# Patient Record
Sex: Male | Born: 1995 | Race: White | Hispanic: No | Marital: Single | Smoking: Never smoker
Health system: Southern US, Community
[De-identification: ages and names within clinical notes are randomized; demographics above are authoritative.]

## PROBLEM LIST (undated history)

## (undated) DIAGNOSIS — R6250 Unspecified lack of expected normal physiological development in childhood: Secondary | ICD-10-CM

## (undated) DIAGNOSIS — C419 Malignant neoplasm of bone and articular cartilage, unspecified: Secondary | ICD-10-CM

## (undated) DIAGNOSIS — E162 Hypoglycemia, unspecified: Secondary | ICD-10-CM

## (undated) DIAGNOSIS — R3589 Other polyuria: Secondary | ICD-10-CM

## (undated) DIAGNOSIS — N62 Hypertrophy of breast: Secondary | ICD-10-CM

## (undated) DIAGNOSIS — G43909 Migraine, unspecified, not intractable, without status migrainosus: Secondary | ICD-10-CM

## (undated) DIAGNOSIS — R7302 Impaired glucose tolerance (oral): Secondary | ICD-10-CM

## (undated) DIAGNOSIS — E300 Delayed puberty: Secondary | ICD-10-CM

## (undated) DIAGNOSIS — E049 Nontoxic goiter, unspecified: Secondary | ICD-10-CM

## (undated) DIAGNOSIS — R358 Other polyuria: Secondary | ICD-10-CM

## (undated) DIAGNOSIS — E230 Hypopituitarism: Secondary | ICD-10-CM

## (undated) HISTORY — DX: Hypertrophy of breast: N62

## (undated) HISTORY — DX: Unspecified lack of expected normal physiological development in childhood: R62.50

## (undated) HISTORY — DX: Other polyuria: R35.8

## (undated) HISTORY — DX: Other polyuria: R35.89

## (undated) HISTORY — DX: Hypopituitarism: E23.0

## (undated) HISTORY — DX: Impaired glucose tolerance (oral): R73.02

## (undated) HISTORY — DX: Hypoglycemia, unspecified: E16.2

## (undated) HISTORY — DX: Nontoxic goiter, unspecified: E04.9

## (undated) HISTORY — PX: HYPOSPADIAS CORRECTION: SHX483

## (undated) HISTORY — DX: Delayed puberty: E30.0

## (undated) HISTORY — DX: Migraine, unspecified, not intractable, without status migrainosus: G43.909

---
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
## 2013-04-18 ENCOUNTER — Emergency Department (HOSPITAL_BASED_OUTPATIENT_CLINIC_OR_DEPARTMENT_OTHER)
Admission: EM | Admit: 2013-04-18 | Discharge: 2013-04-19 | Disposition: A | Discharge status: Home / Self Care | Payer: 59 | Attending: Emergency Medicine | Admitting: Emergency Medicine

## 2013-04-18 ENCOUNTER — Encounter (HOSPITAL_BASED_OUTPATIENT_CLINIC_OR_DEPARTMENT_OTHER): Payer: Self-pay | Admitting: Emergency Medicine

## 2013-04-18 ENCOUNTER — Emergency Department (HOSPITAL_BASED_OUTPATIENT_CLINIC_OR_DEPARTMENT_OTHER): Payer: 59

## 2013-04-18 DIAGNOSIS — Z8639 Personal history of other endocrine, nutritional and metabolic disease: Secondary | ICD-10-CM | POA: Insufficient documentation

## 2013-04-18 DIAGNOSIS — Z79899 Other long term (current) drug therapy: Secondary | ICD-10-CM | POA: Insufficient documentation

## 2013-04-18 DIAGNOSIS — Z87448 Personal history of other diseases of urinary system: Secondary | ICD-10-CM | POA: Insufficient documentation

## 2013-04-18 DIAGNOSIS — Y9339 Activity, other involving climbing, rappelling and jumping off: Secondary | ICD-10-CM | POA: Insufficient documentation

## 2013-04-18 DIAGNOSIS — E230 Hypopituitarism: Secondary | ICD-10-CM | POA: Insufficient documentation

## 2013-04-18 DIAGNOSIS — IMO0002 Reserved for concepts with insufficient information to code with codable children: Secondary | ICD-10-CM | POA: Insufficient documentation

## 2013-04-18 DIAGNOSIS — Z862 Personal history of diseases of the blood and blood-forming organs and certain disorders involving the immune mechanism: Secondary | ICD-10-CM | POA: Insufficient documentation

## 2013-04-18 DIAGNOSIS — Z880 Allergy status to penicillin: Secondary | ICD-10-CM | POA: Insufficient documentation

## 2013-04-18 DIAGNOSIS — Y929 Unspecified place or not applicable: Secondary | ICD-10-CM | POA: Insufficient documentation

## 2013-04-18 DIAGNOSIS — S060X9A Concussion with loss of consciousness of unspecified duration, initial encounter: Secondary | ICD-10-CM | POA: Insufficient documentation

## 2013-04-18 DIAGNOSIS — Z8679 Personal history of other diseases of the circulatory system: Secondary | ICD-10-CM | POA: Insufficient documentation

## 2013-04-18 DIAGNOSIS — W06XXXA Fall from bed, initial encounter: Secondary | ICD-10-CM | POA: Insufficient documentation

## 2013-04-18 NOTE — ED Notes (Signed)
Patient was jumping on the bed and hit his head against the wall  On Thursday.  Patient states that his head is hurting, has experienced nausea and his head hurts more.

## 2013-04-18 NOTE — ED Notes (Signed)
Patient transported to CT via stretcher per tech. 

## 2013-04-18 NOTE — ED Provider Notes (Signed)
CSN: [id number]     Arrival date & time 04/18/13  2129 History   This chart was scribed for Michael Lyons, MD by Ronal Fear, ED Scribe. This patient was seen in room MH12/MH12 and the patient's care was started at 10:35 PM.    Chief Complaint  Patient presents with  . Head Injury    Patient is a 17 y.o. male presenting with head injury. The history is provided by the patient. No language interpreter was used.  Head Injury Location:  Occipital Time since incident:  2 days Mechanism of injury: self-inflicted   Chronicity:  New Relieved by:  Rest Worsened by:  Nothing tried Ineffective treatments:  None tried Associated symptoms: headache, loss of consciousness and nausea   Associated symptoms: no blurred vision, no disorientation, no double vision, no hearing loss, no memory loss, no numbness and no vomiting     Pt hit the back of his head on the wall 2x days ago while jumping onto his bed in his dorm.  Pt lost consciousness. He was seen [facility] the school [facility] and was diagnosed with an acute concussion.  Pt does not currently feel nauseous but did feel nauseous immediately after the head trauma. He denies numbness and tingling. Pt has had 1 episode of visual disturbance yesterday.  Pt has a hx of vertigo  Past Medical History  Diagnosis Date  . Physical growth delay   . Growth hormone deficiency (human)   . Puberty delay   . Goiter   . Hypoglycemia   . Gynecomastia, male   . Polyuria   . Impaired glucose tolerance   . Migraines    Past Surgical History  Procedure Laterality Date  . Hypospadias correction     Family History  Problem Relation Age of Onset  . Thyroid disease Mother   . Diabetes Maternal Grandmother   . Thyroid disease Maternal Grandmother   . Cancer Maternal Grandmother    History  Substance Use Topics  . Smoking status: Never Smoker   . Smokeless tobacco: Never Used  . Alcohol Use: No    Review of Systems  HENT: Negative for hearing loss.    Eyes: Negative for blurred vision and double vision.  Gastrointestinal: Positive for nausea. Negative for vomiting.  Neurological: Positive for loss of consciousness and headaches. Negative for numbness.  Psychiatric/Behavioral: Negative for memory loss.  All other systems reviewed and are negative.    Allergies  Penicillins  Home Medications   Current Outpatient Rx  Name  Route  Sig  Dispense  Refill  . meclizine (ANTIVERT) 25 MG tablet   Oral   Take 25 mg by mouth 3 (three) times daily as needed.         . Cetirizine HCl (ZYRTEC PO)   Oral   Take by mouth as needed.           . Somatropin (NUTROPIN AQ [place])   Subcutaneous   Inject 4.8 mg into the skin daily.            BP 121/74  Pulse 63  Temp(Src) 98.5 F (36.9 C) (Oral)  Resp 16  Ht 5\' 10"  (1.778 m)  Wt 150 lb 6.4 oz (68.221 kg)  BMI 21.58 kg/m2  SpO2 98% Physical Exam  Nursing note and vitals reviewed. Constitutional: He is oriented to person, place, and time. He appears well-developed and well-nourished. No distress.  HENT:  Head: Normocephalic and atraumatic.  Right Ear: External ear normal.  Left Ear: External ear  normal.  Mouth/Throat: Oropharynx is clear and moist.  Eyes: Conjunctivae and EOM are normal. Pupils are equal, round, and reactive to light.  Neck: Normal range of motion. Neck supple. No tracheal deviation present.  Cardiovascular: Normal rate, regular rhythm and normal heart sounds.   Pulmonary/Chest: Effort normal and breath sounds normal. No respiratory distress.  Abdominal: Soft. There is no tenderness.  Musculoskeletal: Normal range of motion.  Neurological: He is alert and oriented to person, place, and time. No cranial nerve deficit. He exhibits normal muscle tone. Coordination normal.  Skin: Skin is warm and dry.  Psychiatric: He has a normal mood and affect. His behavior is normal.    ED Course  Procedures (including critical care time)  DIAGNOSTIC STUDIES: Oxygen  Saturation is 98% on Ra, normal by my interpretation.    COORDINATION OF CARE: 10:40 PM- Pt advised of plan for treatment including CT scan of pt's head and pt's mother agrees.      Labs Review Labs Reviewed - No data to display Imaging Review No results found.  EKG Interpretation   None       MDM  No diagnosis found. Patient presents here after striking his head 2 days ago while jumping onto his bed. States he was knocked out and has been having headaches intermittently since that time. Neurologically he is intact without focal deficit. CT scan of the head is unremarkable. This appears to be a concussion and will require more time. To follow up with primary Dr. if not improving in the next 2 days.  I personally performed the services described in this documentation, which was scribed in my presence. The recorded information has been reviewed and is accurate.      Michael Lyons, MD 04/18/13 2351

## 2013-04-18 NOTE — ED Notes (Signed)
Patient states that he hit his head on a wall Thursday at college and was diagnosed with a concussion by the college physician. He stated that they were going to send him to the ER for a CT scan, but the mother told them not to and she went and got him today and brought him here instead.  He states that he still has a headache and dizziness.

## 2013-04-19 MED ORDER — ACETAMINOPHEN 325 MG PO TABS
650.0000 mg | ORAL_TABLET | Freq: Once | ORAL | Status: AC
  Administered 2013-04-19: 650 mg via ORAL

## 2013-04-19 MED ORDER — ACETAMINOPHEN 325 MG PO TABS
ORAL_TABLET | ORAL | Status: AC
  Filled 2013-04-19: qty 2

## 2014-03-30 ENCOUNTER — Encounter (HOSPITAL_BASED_OUTPATIENT_CLINIC_OR_DEPARTMENT_OTHER): Payer: Self-pay | Admitting: Emergency Medicine

## 2014-03-30 ENCOUNTER — Emergency Department (HOSPITAL_BASED_OUTPATIENT_CLINIC_OR_DEPARTMENT_OTHER)
Admission: EM | Admit: 2014-03-30 | Discharge: 2014-03-30 | Disposition: A | Discharge status: Home / Self Care | Payer: 59 | Attending: Emergency Medicine | Admitting: Emergency Medicine

## 2014-03-30 ENCOUNTER — Emergency Department (HOSPITAL_BASED_OUTPATIENT_CLINIC_OR_DEPARTMENT_OTHER): Payer: 59

## 2014-03-30 DIAGNOSIS — N3000 Acute cystitis without hematuria: Secondary | ICD-10-CM | POA: Insufficient documentation

## 2014-03-30 DIAGNOSIS — Z862 Personal history of diseases of the blood and blood-forming organs and certain disorders involving the immune mechanism: Secondary | ICD-10-CM | POA: Insufficient documentation

## 2014-03-30 DIAGNOSIS — C419 Malignant neoplasm of bone and articular cartilage, unspecified: Secondary | ICD-10-CM | POA: Insufficient documentation

## 2014-03-30 DIAGNOSIS — R5081 Fever presenting with conditions classified elsewhere: Secondary | ICD-10-CM

## 2014-03-30 DIAGNOSIS — Z880 Allergy status to penicillin: Secondary | ICD-10-CM | POA: Diagnosis not present

## 2014-03-30 DIAGNOSIS — Z87440 Personal history of urinary (tract) infections: Secondary | ICD-10-CM | POA: Insufficient documentation

## 2014-03-30 DIAGNOSIS — D709 Neutropenia, unspecified: Secondary | ICD-10-CM | POA: Diagnosis not present

## 2014-03-30 DIAGNOSIS — R509 Fever, unspecified: Secondary | ICD-10-CM | POA: Diagnosis present

## 2014-03-30 DIAGNOSIS — Z8679 Personal history of other diseases of the circulatory system: Secondary | ICD-10-CM | POA: Insufficient documentation

## 2014-03-30 DIAGNOSIS — Z87710 Personal history of (corrected) hypospadias: Secondary | ICD-10-CM | POA: Diagnosis not present

## 2014-03-30 DIAGNOSIS — Z8639 Personal history of other endocrine, nutritional and metabolic disease: Secondary | ICD-10-CM | POA: Insufficient documentation

## 2014-03-30 DIAGNOSIS — Z79899 Other long term (current) drug therapy: Secondary | ICD-10-CM | POA: Insufficient documentation

## 2014-03-30 HISTORY — DX: Malignant neoplasm of bone and articular cartilage, unspecified: C41.9

## 2014-03-30 LAB — COMPREHENSIVE METABOLIC PANEL
ALT: 81 U/L — AB (ref 0–53)
AST: 19 U/L (ref 0–37)
Albumin: 4 g/dL (ref 3.5–5.2)
Alkaline Phosphatase: 107 U/L (ref 39–117)
Anion gap: 16 — ABNORMAL HIGH (ref 5–15)
BUN: 13 mg/dL (ref 6–23)
CALCIUM: 9.8 mg/dL (ref 8.4–10.5)
CO2: 25 mEq/L (ref 19–32)
Chloride: 95 mEq/L — ABNORMAL LOW (ref 96–112)
Creatinine, Ser: 0.9 mg/dL (ref 0.50–1.35)
GFR calc non Af Amer: >90 mL/min (ref >90)
Glucose, Bld: 115 mg/dL — ABNORMAL HIGH (ref 70–99)
Potassium: 3.9 mEq/L (ref 3.7–5.3)
SODIUM: 136 meq/L — AB (ref 137–147)
TOTAL PROTEIN: 7.8 g/dL (ref 6.0–8.3)
Total Bilirubin: 1.1 mg/dL (ref 0.3–1.2)

## 2014-03-30 LAB — URINALYSIS, ROUTINE W REFLEX MICROSCOPIC
GLUCOSE, UA: NEGATIVE mg/dL
HGB URINE DIPSTICK: NEGATIVE
Ketones, ur: 15 mg/dL — AB
Nitrite: NEGATIVE
Protein, ur: 30 mg/dL — AB
SPECIFIC GRAVITY, URINE: 1.03 (ref 1.005–1.030)
UROBILINOGEN UA: 2 mg/dL — AB (ref 0.0–1.0)
pH: 7 (ref 5.0–8.0)

## 2014-03-30 LAB — CBC WITH DIFFERENTIAL/PLATELET
BASOS ABS: 0 10*3/uL (ref 0.0–0.1)
Basophils Relative: 0 % (ref 0–1)
Eosinophils Absolute: 0 10*3/uL (ref 0.0–0.7)
Eosinophils Relative: 2 % (ref 0–5)
HCT: 33 % — ABNORMAL LOW (ref 39.0–52.0)
Hemoglobin: 11.8 g/dL — ABNORMAL LOW (ref 13.0–17.0)
LYMPHS ABS: 0.4 10*3/uL — AB (ref 0.7–4.0)
Lymphocytes Relative: 77 % — ABNORMAL HIGH (ref 12–46)
MCH: 30.2 pg (ref 26.0–34.0)
MCHC: 35.8 g/dL (ref 30.0–36.0)
MCV: 84.4 fL (ref 78.0–100.0)
MONO ABS: 0.1 10*3/uL (ref 0.1–1.0)
Monocytes Relative: 19 % — ABNORMAL HIGH (ref 3–12)
Neutro Abs: 0 10*3/uL — ABNORMAL LOW (ref 1.7–7.7)
Neutrophils Relative %: 2 % — ABNORMAL LOW (ref 43–77)
PLATELETS: 35 10*3/uL — AB (ref 150–400)
RBC: 3.91 MIL/uL — ABNORMAL LOW (ref 4.22–5.81)
RDW: 10.5 % — AB (ref 11.5–15.5)
WBC: 0.5 10*3/uL — AB (ref 4.0–10.5)

## 2014-03-30 LAB — URINE MICROSCOPIC-ADD ON

## 2014-03-30 LAB — I-STAT CG4 LACTIC ACID, ED: LACTIC ACID, VENOUS: 0.84 mmol/L (ref 0.5–2.2)

## 2014-03-30 MED ORDER — VANCOMYCIN HCL 500 MG IV SOLR
INTRAVENOUS | Status: AC
  Filled 2014-03-30: qty 1500

## 2014-03-30 MED ORDER — HEPARIN SOD (PORK) LOCK FLUSH 100 UNIT/ML IV SOLN
INTRAVENOUS | Status: AC
  Filled 2014-03-30: qty 5

## 2014-03-30 MED ORDER — METOCLOPRAMIDE HCL 5 MG/ML IJ SOLN
10.0000 mg | Freq: Once | INTRAMUSCULAR | Status: AC
  Administered 2014-03-30: 10 mg via INTRAVENOUS
  Filled 2014-03-30: qty 2

## 2014-03-30 MED ORDER — KETOROLAC TROMETHAMINE 30 MG/ML IJ SOLN
30.0000 mg | Freq: Once | INTRAMUSCULAR | Status: AC
  Administered 2014-03-30: 30 mg via INTRAVENOUS

## 2014-03-30 MED ORDER — DIPHENHYDRAMINE HCL 50 MG/ML IJ SOLN
25.0000 mg | Freq: Once | INTRAMUSCULAR | Status: AC
  Administered 2014-03-30: 25 mg via INTRAVENOUS
  Filled 2014-03-30: qty 1

## 2014-03-30 MED ORDER — VANCOMYCIN HCL 10 G IV SOLR
1500.0000 mg | Freq: Once | INTRAVENOUS | Status: AC
  Administered 2014-03-30: 1500 mg via INTRAVENOUS
  Filled 2014-03-30: qty 1500

## 2014-03-30 MED ORDER — DEXTROSE 5 % IV SOLN
2.0000 g | Freq: Three times a day (TID) | INTRAVENOUS | Status: DC
  Administered 2014-03-30: 2 g via INTRAVENOUS

## 2014-03-30 MED ORDER — HEPARIN SOD (PORK) LOCK FLUSH 100 UNIT/ML IV SOLN
500.0000 [IU] | Freq: Once | INTRAVENOUS | Status: AC
  Administered 2014-03-30: 500 [IU]

## 2014-03-30 MED ORDER — CEFEPIME HCL 2 G IJ SOLR
INTRAMUSCULAR | Status: AC
  Filled 2014-03-30: qty 2

## 2014-03-30 MED ORDER — KETOROLAC TROMETHAMINE 30 MG/ML IJ SOLN
INTRAMUSCULAR | Status: AC
  Filled 2014-03-30: qty 1

## 2014-03-30 NOTE — ED Notes (Signed)
Family at bedside. 

## 2014-03-30 NOTE — Progress Notes (Signed)
ANTIBIOTIC CONSULT NOTE - INITIAL  Pharmacy Consult for Cefepime Indication: febrile neutropenia  Allergies  Allergen Reactions  . Penicillins     Patient Measurements: Height: 5\' 10"  (177.8 cm) Weight: 155 lb (70.308 kg) IBW/kg (Calculated) : 73 Adjusted Body Weight:   Vital Signs: Temp: 100.5 F (38.1 C) (09/22 1514) Temp src: Oral (09/22 1514) BP: 102/61 mmHg (09/22 1514) Pulse Rate: 102 (09/22 1514) Intake/Output from previous day:   Intake/Output from this shift:    Labs:  Recent Labs  03/30/14 1537  WBC 0.5*  HGB 11.8*  PLT 35*  CREATININE 0.90   Estimated Creatinine Clearance: 131.3 ml/min (by C-G formula based on Cr of 0.9). No results found for this basename: VANCOTROUGH, VANCOPEAK, VANCORANDOM, GENTTROUGH, GENTPEAK, GENTRANDOM, TOBRATROUGH, TOBRAPEAK, TOBRARND, AMIKACINPEAK, AMIKACINTROU, AMIKACIN,  in the last 72 hours   Microbiology: No results found for this or any previous visit (from the past 720 hour(s)).  Medical History: Past Medical History  Diagnosis Date  . Physical growth delay   . Growth hormone deficiency (human)   . Puberty delay   . Goiter   . Hypoglycemia   . Gynecomastia, male   . Polyuria   . Impaired glucose tolerance   . Migraines   . Ewing sarcoma     Medications:   (Not in a hospital admission) Scheduled:  . vancomycin       Infusions:  . ceFEPime (MAXIPIME) IV    . vancomycin     Assessment: 18yo male presents to [place] with dysuria for 2 days and fever in setting of recent start of chemotherapy for Ewing sarcoma. Pharmacy is consulted to dose cefepime for febrile neutropenia. Pt is febrile to 100.5, WBC 0.5, sCr 0.9, CrCl >100 mL/min.   Goal of Therapy:  Resolution of infection  Plan:  Start cefepime 2g IV q8h Follow up culture results, renal function, and clinical course  Andrey Cota. Diona Foley, PharmD Clinical Pharmacist Pager [phone] 03/30/2014,6:09 PM

## 2014-03-30 NOTE — Discharge Instructions (Signed)
Neutropenic Fever °Neutropenic fever is a type of fever that can develop in someone who has a very low number of a certain kind of white blood cells called neutrophils (neutropenia). These blood cells are important for fighting infections caused by bacteria and fungi. When you have neutropenia, you could be in danger of a severe infection. You may need to start taking antibiotic medicines. °CAUSES °Neutrophils are made in the spongy tissue inside your bones (bone marrow). Anything that damages your bone marrow or damages neutrophils after they leave your bone marrow can cause neutropenia. Once you have a dangerously low level of neutrophils, you are at risk for infection and neutropenic fever. °Causes of neutropenia may include: °· Cancer treatments. °· Bone marrow cancer. °· Cancer of the white blood cells (leukemia or myeloma). °· Severe infection. °· Bone marrow failure (aplastic anemia). °· Many types of medicines. °· Diseases of the body's defense system (autoimmune diseases). °· Inherited genes that cause neutropenia. °· Vitamin B deficiency. °· Spleen enlargement in rheumatoid arthritis (Felty syndrome). °SIGNS AND SYMPTOMS °Fever is the main symptom of neutropenic fever. Other signs and symptoms may include: °· Chills. °· Fatigue. °· Painful mouth ulcers. °· Cough. °· Shortness of breath. °· Swollen glands (lymph nodes). °· Sore throat. °· Sinus and ear infections. °· Gum disease. °· Skin infection. °· Burning and frequent urination. °· Rectal infections. °· Vaginal discharge or itching. °DIAGNOSIS  °Your health care provider may diagnose neutropenic fever if your neutrophil count is less than 500 neutrophils per microliter of blood and you have a fever of at least 100.4°F (38.0°C).  °· Blood tests and other tests that measure neutrophils will be done. These may include: °¨ A complete blood count (CBC) and a differential white blood count (WBC). °¨ Peripheral smear. This test involves checking a blood sample  under a microscope. °· Other types of tests may also be done, including: °¨ Chest X-rays. °¨ Cultures of blood and body fluids to look for a source of infection. °Your health care provider will also determine if your neutropenic fever is high risk or low risk. °· You may have high-risk neutropenic fever if: °¨ Your neutrophil count is less than 100 neutrophils per microliter of blood. °¨ You have also been diagnosed with pneumonia or another serious medical problem. °¨ Your condition requires you to be treated in the hospital. °· You may have low-risk neutropenic fever if: °¨ Your neutrophil count is more than 100 neutrophils per microliter of blood. °¨ Your chest X-ray is normal. °¨ You do not have an active illness or any other problems that require you to be in the hospital. °TREATMENT  °You may start treatment as soon as you get diagnosed with neutropenic fever, even if your health care provider is still looking for the source of infection. °· Treatment for high-risk neutropenic fever is antibiotic medicine given through an IV access tube. This is done in the hospital. You may be given a single antibiotic or a combination of antibiotics. °· Low-risk neutropenic fever may be treated at home. You may have to take one or two different oral antibiotics. In some cases, you may need to be treated with IV antibiotics that are given by a home health care provider who visits your home. °· If your health care provider finds a specific cause of infection, you may be switched to the antibiotics that work best against those particular bacteria. °· If a fungal infection is found, your medicine will be changed to an antifungal   medicine. °· If the fever goes away in 3-5 days, you may have to take medicine for about 7 days. If the fever is not responding, you may have to take medicine longer. °· You may have to stop taking any medicine that could be causing neutropenic fever. °· If you have neutropenic fever from cancer  treatment drugs (chemotherapy), you may need to take a type of medicine called white blood cell growth factors. This medicine can help prevent fever. °HOME CARE INSTRUCTIONS °· Only take medicines as directed by your health care provider. °¨ If you are being treated with oral antibiotics at home, you may need to return to your health care provider every day to have your CBC checked. You may have to do this until your fever responds. °¨ Take your antibiotics as directed. Make sure you finish them even if you start to feel better. °· Preventing infection is important when you have neutropenia. Here are some ways to prevent infections: °¨ Avoid sick friends and family members. °¨ Wash your hands often. °¨ Do not eat uncooked or undercooked meats. °¨ Wash all fruits and vegetables. °¨ Do not eat or drink unpasteurized dairy products. °¨ Get regular dental care, and maintain good dental hygiene. °¨ Get a flu shot. Ask your health care provider whether you need any other vaccines. °¨ Wear gloves when gardening. °· Follow up with your health care provider as directed. °SEEK MEDICAL CARE IF: °· You have chills. °· You have a fever. °· You have signs or symptoms of infection. °SEEK IMMEDIATE MEDICAL CARE IF: °· You have trouble breathing. °· You have chest pain. °Document Released: 06/30/2013 Document Reviewed: 06/30/2013 °[employer]® Patient Information ©2015 [employer]. This information is not intended to replace advice given to you by your health care provider. Make sure you discuss any questions you have with your health care provider. ° °

## 2014-03-30 NOTE — ED Notes (Signed)
MD at bedside. 

## 2014-03-30 NOTE — ED Notes (Signed)
Patient transported to X-ray 

## 2014-03-30 NOTE — ED Provider Notes (Signed)
CSN: [id number]     Arrival date & time 03/30/14  1455 History   First MD Initiated Contact with Patient 03/30/14 1535     Chief Complaint  Patient presents with  . Fever     (Consider location/radiation/quality/duration/timing/severity/associated sxs/prior Treatment) Patient is a 18 y.o. male presenting with dysuria.  Dysuria This is a new problem. The current episode started 2 days ago. The problem occurs constantly. The problem has not changed since onset.Associated symptoms include headaches. Pertinent negatives include no chest pain, no abdominal pain and no shortness of breath. Nothing aggravates the symptoms. Nothing relieves the symptoms. He has tried nothing for the symptoms.    Past Medical History  Diagnosis Date  . Physical growth delay   . Growth hormone deficiency (human)   . Puberty delay   . Goiter   . Hypoglycemia   . Gynecomastia, male   . Polyuria   . Impaired glucose tolerance   . Migraines   . Ewing sarcoma    Past Surgical History  Procedure Laterality Date  . Hypospadias correction     Family History  Problem Relation Age of Onset  . Thyroid disease Mother   . Diabetes Maternal Grandmother   . Thyroid disease Maternal Grandmother   . Cancer Maternal Grandmother    History  Substance Use Topics  . Smoking status: Never Smoker   . Smokeless tobacco: Never Used  . Alcohol Use: No    Review of Systems  Constitutional: Positive for fever (tmax 101.1). Negative for chills.  HENT: Negative for congestion, rhinorrhea and sore throat.   Eyes: Negative for photophobia and visual disturbance.  Respiratory: Negative for cough and shortness of breath.   Cardiovascular: Negative for chest pain and leg swelling.  Gastrointestinal: Negative for nausea, vomiting, abdominal pain, diarrhea and constipation.  Endocrine: Negative for polydipsia and polyuria.  Genitourinary: Positive for dysuria. Negative for hematuria.  Musculoskeletal: Negative for  arthralgias and back pain.  Skin: Negative for color change and rash.  Neurological: Positive for headaches. Negative for dizziness, syncope and light-headedness.  Hematological: Negative for adenopathy. Does not bruise/bleed easily.  All other systems reviewed and are negative.     Allergies  Penicillins  Home Medications   Prior to Admission medications   Medication Sig Start Date End Date Taking? Authorizing Provider  LORazepam (ATIVAN) 1 MG tablet Take 1 mg by mouth every 8 (eight) hours.   Yes Historical Provider, MD  ondansetron (ZOFRAN-ODT) 8 MG disintegrating tablet Take 8 mg by mouth every 8 (eight) hours as needed for nausea or vomiting.   Yes Historical Provider, MD  traMADol (ULTRAM) 50 MG tablet Take by mouth every 6 (six) hours as needed.   Yes Historical Provider, MD   BP 96/61  Pulse 90  Temp(Src) 100.6 F (38.1 C) (Oral)  Resp 16  Ht 5\' 10"  (1.778 m)  Wt 155 lb (70.308 kg)  BMI 22.24 kg/m2  SpO2 100% Physical Exam  Vitals reviewed. Constitutional: He is oriented to person, place, and time. He appears well-developed and well-nourished.  HENT:  Head: Normocephalic and atraumatic.  Eyes: Conjunctivae and EOM are normal.  Neck: Normal range of motion. Neck supple.  Cardiovascular: Normal rate, regular rhythm and normal heart sounds.   Pulmonary/Chest: Effort normal and breath sounds normal. No respiratory distress.  Abdominal: He exhibits no distension. There is no tenderness. There is no rebound and no guarding.  Musculoskeletal: Normal range of motion.  Neurological: He is alert and oriented to person, place, and time.  Skin: Skin is warm and dry.    ED Course  Procedures (including critical care time) Labs Review Labs Reviewed  CBC WITH DIFFERENTIAL - Abnormal; Notable for the following:    WBC 0.5 (*)    RBC 3.91 (*)    Hemoglobin 11.8 (*)    HCT 33.0 (*)    RDW 10.5 (*)    Platelets 35 (*)    Neutrophils Relative % 2 (*)    Lymphocytes  Relative 77 (*)    Monocytes Relative 19 (*)    Neutro Abs 0.0 (*)    Lymphs Abs 0.4 (*)    All other components within normal limits  COMPREHENSIVE METABOLIC PANEL - Abnormal; Notable for the following:    Sodium 136 (*)    Chloride 95 (*)    Glucose, Bld 115 (*)    ALT 81 (*)    Anion gap 16 (*)    All other components within normal limits  URINALYSIS, ROUTINE W REFLEX MICROSCOPIC - Abnormal; Notable for the following:    Color, Urine ORANGE (*)    APPearance CLOUDY (*)    Bilirubin Urine SMALL (*)    Ketones, ur 15 (*)    Protein, ur 30 (*)    Urobilinogen, UA 2.0 (*)    Leukocytes, UA TRACE (*)    All other components within normal limits  CULTURE, BLOOD (SINGLE)  CULTURE, BLOOD (SINGLE)  URINE CULTURE  URINE MICROSCOPIC-ADD ON  I-STAT CG4 LACTIC ACID, ED    Imaging Review Dg Chest 2 View  03/30/2014   CLINICAL DATA:  Fever, hematuria  EXAM: CHEST  2 VIEW  COMPARISON:  None.  FINDINGS: Cardiomediastinal silhouette is unremarkable. Right IJ Port-A-Cath in place. No acute infiltrate or pulmonary edema. Bony thorax is unremarkable.  IMPRESSION: No active cardiopulmonary disease.   Electronically Signed   By: Lahoma Crocker M.D.   On: 03/30/2014 16:05     EKG Interpretation None      MDM   Final diagnoses:  Febrile neutropenia  Acute cystitis without hematuria    18 y.o. male with pertinent PMH of ewings sarcoma, hypospadias sp repair presents with fever in setting of recent start of chemotherapy for Ewing sarcoma. The patient has had 3 urinary tract infections over the last 6 months after hypospadias repair. Patient's symptoms began approximately 2 days ago with dysuria, hematuria, yesterday patient developed temperature to 100.3, followed today by temperature of 101.1. He had just started chemotherapy.  The patient has had a headache, however does have a history of migraines and states this one does feel somewhat similar.  On arrival today vitals signs as above with oral  temperature 100.5. Physical exam demonstrated no signs of meningismus or abdominal tenderness. Port site which was placed in the last month is not erythematous and mildly tender.  Labs as above with 0.0 ANC.  Spoke with [facility] oncologist on call.  They advised vanc/cefepime and transfer for admission.  Discussed transfer of pt, and agreed that he was stable to transfer by POV with parents, and spoke with them and they agree.  Pt transferred in stable condition after port deaccessed.  1. Febrile neutropenia   2. Acute cystitis without hematuria         Debby Freiberg, MD 03/31/14 [phone]

## 2014-03-30 NOTE — ED Notes (Addendum)
Patient here with fever and hematuria that started last pm. Just finished first cycle of chemo at [facility] for Ewings sarcoma. Does have port and scheduled to begin 2nd round of chemo on 9/30. Patient pale on arrival, and 101 highest today. Decreased appetite. Mother also reports 2 episodes of vomiting. Until last pm has had no problems with his chemo

## 2014-03-31 LAB — URINE CULTURE: Colony Count: 30000

## 2014-04-05 LAB — CULTURE, BLOOD (SINGLE)
CULTURE: NO GROWTH
Culture: NO GROWTH

## 2014-12-01 IMAGING — CT CT HEAD W/O CM
1 series · 16 of 30 positions shown, 20 images · non-contrast
Comparison: MRI of the brain performed 04/15/2007

CLINICAL DATA: Hit base of head against wall; headache, blurred
vision and dizziness.

CT HEAD WITHOUT CONTRAST
TECHNIQUE: Contiguous axial images were obtained from the base of
the skull through the vertex without contrast.

[Series 2: head 4.8 h37s · axial · 0.44mm/px · z∈[+1135,+1295]mm · 16 of 36 slices shown, 20 images]
[im 2/36  brain]
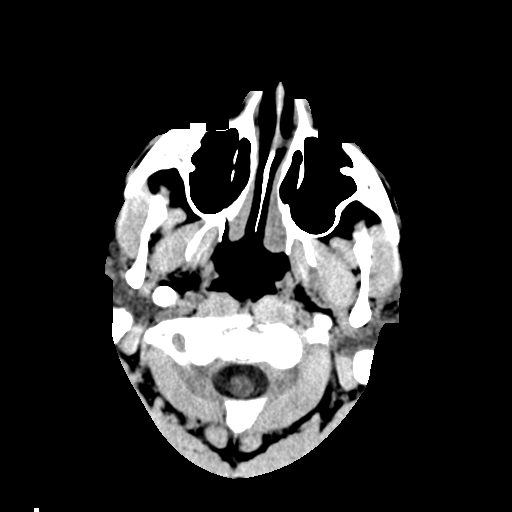
[im 2/36  bone]
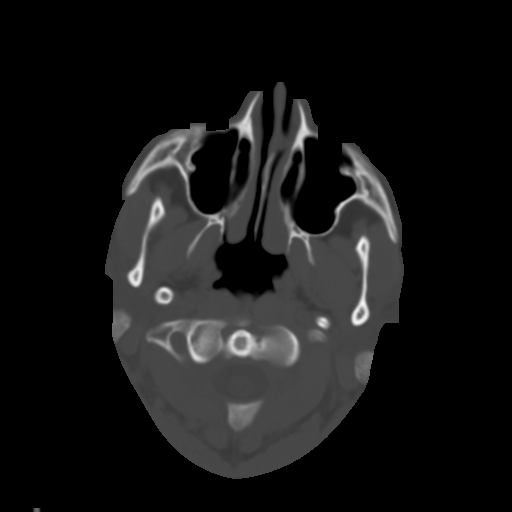
[im 4/36  brain]
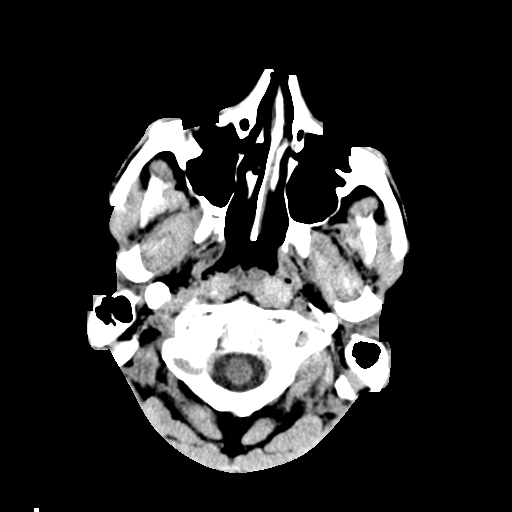
[im 7/36  brain]
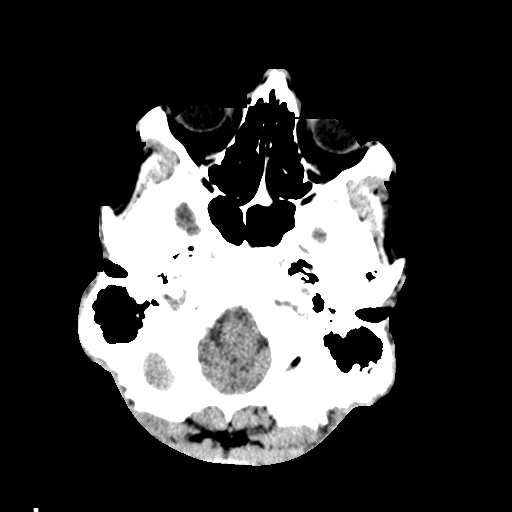
[im 9/36  brain]
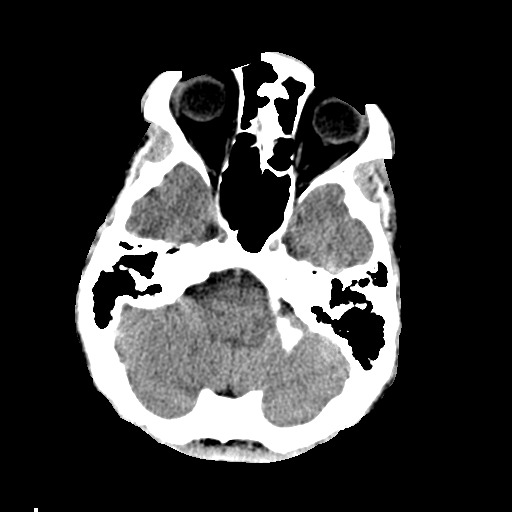
[im 10/36  brain]
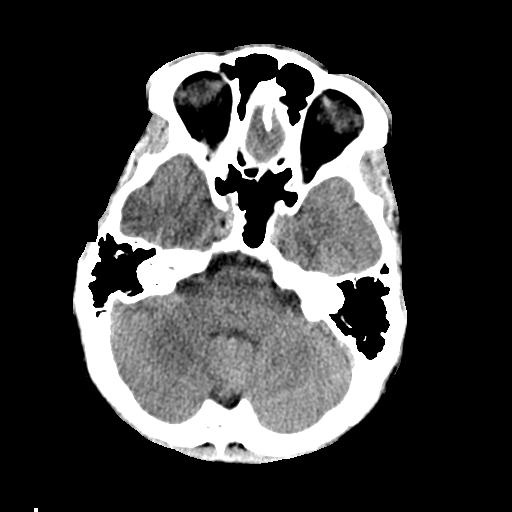
[im 10/36  bone]
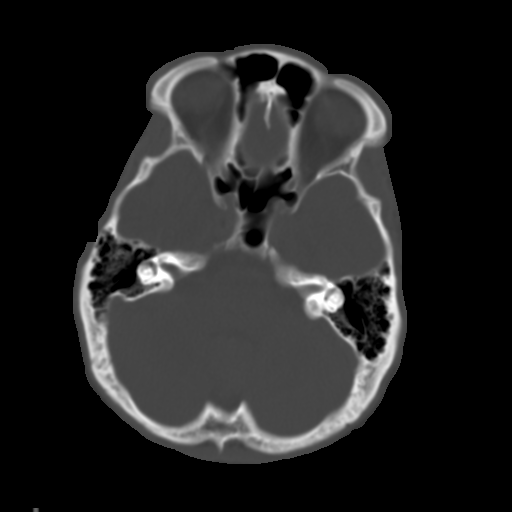
[im 13/36  brain]
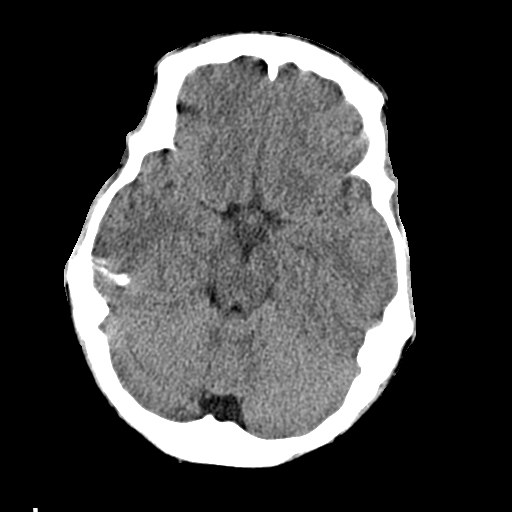
[im 15/36  brain]
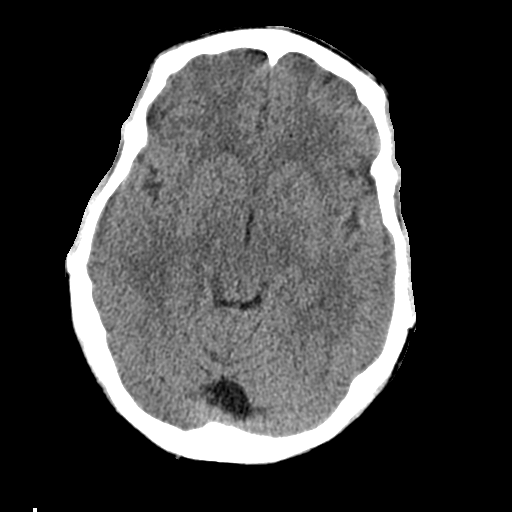
[im 17/36  brain]
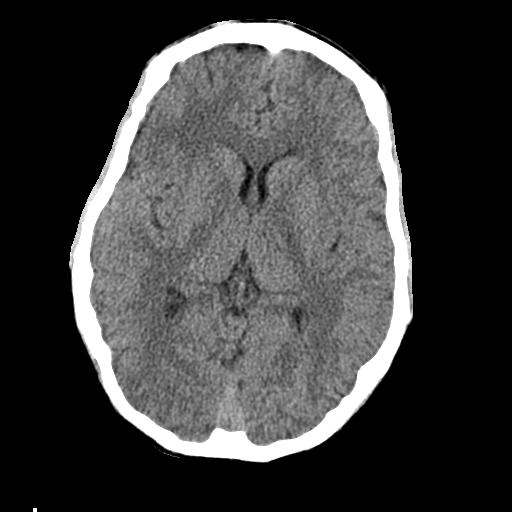
[im 19/36  brain]
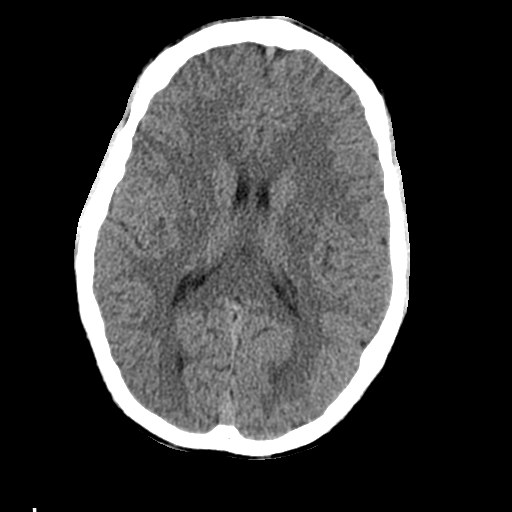
[im 19/36  bone]
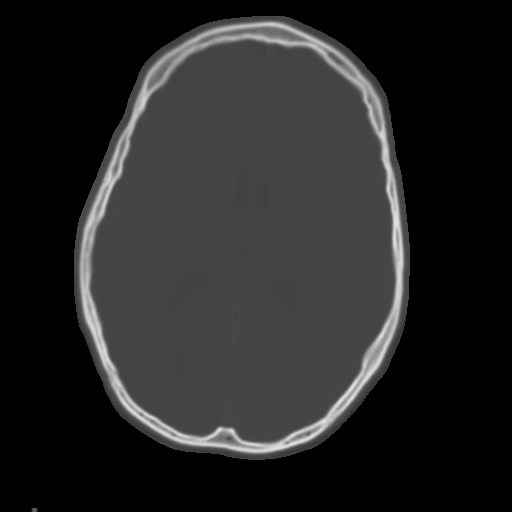
[im 21/36  brain]
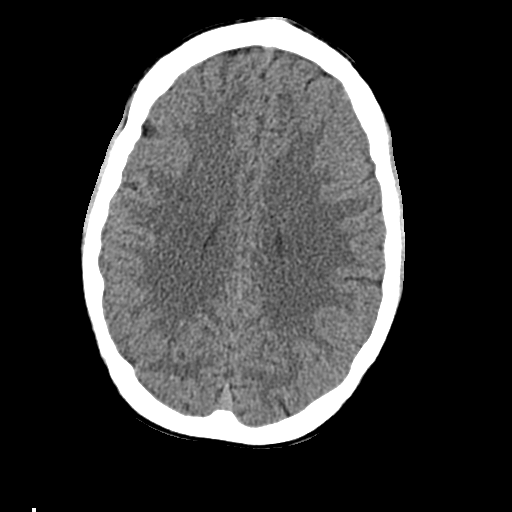
[im 23/36  brain]
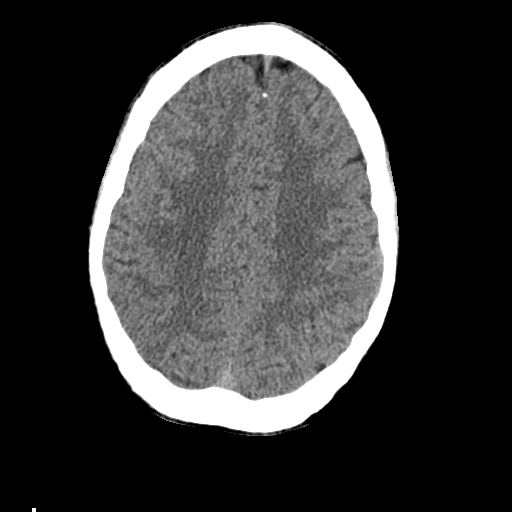
[im 26/36  brain]
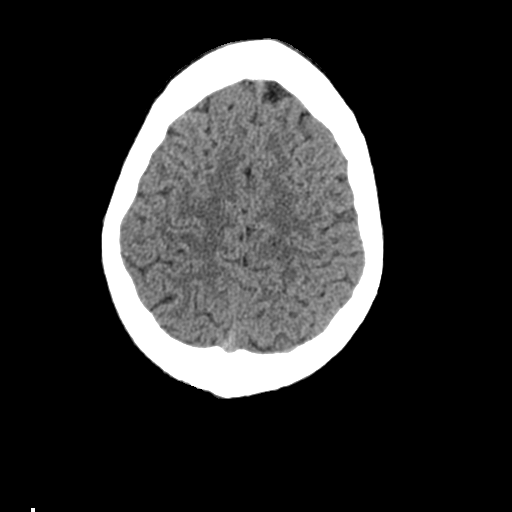
[im 27/36  brain]
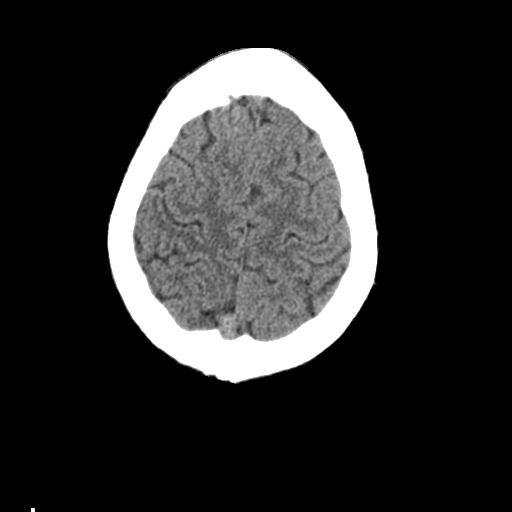
[im 27/36  bone]
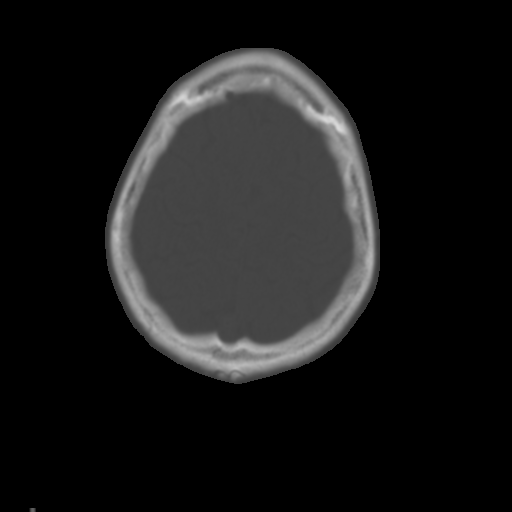
[im 29/36  brain]
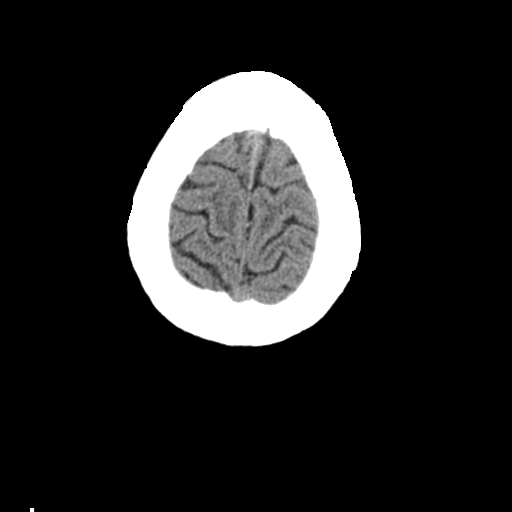
[im 32/36  brain]
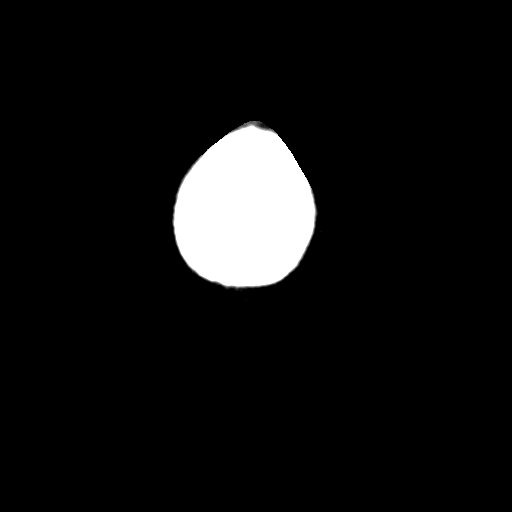
[im 34/36  brain]
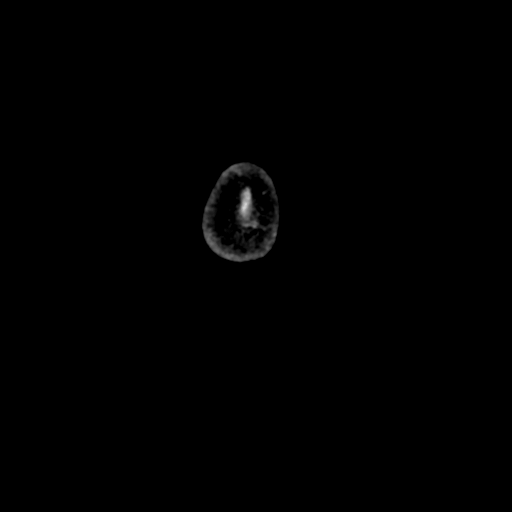

[16 of 30 positions shown; findings below may reference images not displayed]

FINDINGS: There is no evidence of acute infarction, mass lesion, or
intra- or extra-axial hemorrhage on CT.

The posterior fossa, including the cerebellum, brainstem and fourth
ventricle, is within normal limits.  The third and lateral
ventricles, and basal ganglia are unremarkable in appearance.  The
cerebral hemispheres are symmetric in appearance, with normal gray-
white differentiation.  No mass effect or midline shift is seen.

There is no evidence of fracture; visualized osseous structures are
unremarkable in appearance.  The orbits are within normal limits.
The paranasal sinuses and mastoid air cells are well-aerated.  No
significant soft tissue abnormalities are seen.
IMPRESSION: No evidence of traumatic intracranial injury or fracture.

## 2023-11-18 ENCOUNTER — Other Ambulatory Visit: Payer: Self-pay

## 2023-11-18 ENCOUNTER — Emergency Department (HOSPITAL_BASED_OUTPATIENT_CLINIC_OR_DEPARTMENT_OTHER)

## 2023-11-18 ENCOUNTER — Encounter (HOSPITAL_BASED_OUTPATIENT_CLINIC_OR_DEPARTMENT_OTHER): Payer: Self-pay

## 2023-11-18 ENCOUNTER — Observation Stay (HOSPITAL_BASED_OUTPATIENT_CLINIC_OR_DEPARTMENT_OTHER)
Admission: EM | Admit: 2023-11-18 | Discharge: 2023-11-20 | Disposition: A | Discharge status: Home / Self Care | Attending: Internal Medicine | Admitting: Internal Medicine

## 2023-11-18 DIAGNOSIS — Z79899 Other long term (current) drug therapy: Secondary | ICD-10-CM | POA: Diagnosis not present

## 2023-11-18 DIAGNOSIS — R55 Syncope and collapse: Secondary | ICD-10-CM | POA: Diagnosis not present

## 2023-11-18 DIAGNOSIS — E860 Dehydration: Secondary | ICD-10-CM | POA: Insufficient documentation

## 2023-11-18 DIAGNOSIS — R1110 Vomiting, unspecified: Secondary | ICD-10-CM | POA: Diagnosis present

## 2023-11-18 DIAGNOSIS — N179 Acute kidney failure, unspecified: Secondary | ICD-10-CM | POA: Insufficient documentation

## 2023-11-18 DIAGNOSIS — N12 Tubulo-interstitial nephritis, not specified as acute or chronic: Principal | ICD-10-CM | POA: Diagnosis present

## 2023-11-18 DIAGNOSIS — N3000 Acute cystitis without hematuria: Secondary | ICD-10-CM | POA: Diagnosis not present

## 2023-11-18 DIAGNOSIS — N10 Acute tubulo-interstitial nephritis: Secondary | ICD-10-CM | POA: Diagnosis not present

## 2023-11-18 DIAGNOSIS — G43719 Chronic migraine without aura, intractable, without status migrainosus: Secondary | ICD-10-CM

## 2023-11-18 DIAGNOSIS — C419 Malignant neoplasm of bone and articular cartilage, unspecified: Secondary | ICD-10-CM | POA: Diagnosis not present

## 2023-11-18 DIAGNOSIS — N390 Urinary tract infection, site not specified: Secondary | ICD-10-CM | POA: Diagnosis not present

## 2023-11-18 DIAGNOSIS — G43909 Migraine, unspecified, not intractable, without status migrainosus: Secondary | ICD-10-CM | POA: Diagnosis not present

## 2023-11-18 LAB — COMPREHENSIVE METABOLIC PANEL WITH GFR
ALT: 38 U/L (ref 0–44)
AST: 29 U/L (ref 15–41)
Albumin: 4.9 g/dL (ref 3.5–5.0)
Alkaline Phosphatase: 114 U/L (ref 38–126)
Anion gap: 14 (ref 5–15)
BUN: 13 mg/dL (ref 6–20)
CO2: 24 mmol/L (ref 22–32)
Calcium: 10.2 mg/dL (ref 8.9–10.3)
Chloride: 98 mmol/L (ref 98–111)
Creatinine, Ser: 1.33 mg/dL — ABNORMAL HIGH (ref 0.61–1.24)
GFR, Estimated: >60 mL/min (ref >60)
Glucose, Bld: 111 mg/dL — ABNORMAL HIGH (ref 70–99)
Potassium: 3.7 mmol/L (ref 3.5–5.1)
Sodium: 136 mmol/L (ref 135–145)
Total Bilirubin: 0.6 mg/dL (ref 0.0–1.2)
Total Protein: 7.5 g/dL (ref 6.5–8.1)

## 2023-11-18 LAB — URINALYSIS, ROUTINE W REFLEX MICROSCOPIC
Bilirubin Urine: NEGATIVE
Glucose, UA: NEGATIVE mg/dL
Hgb urine dipstick: NEGATIVE
Ketones, ur: NEGATIVE mg/dL
Nitrite: NEGATIVE
Protein, ur: 30 mg/dL — AB
Specific Gravity, Urine: 1.015 (ref 1.005–1.030)
pH: 8.5 — ABNORMAL HIGH (ref 5.0–8.0)

## 2023-11-18 LAB — URINALYSIS, MICROSCOPIC (REFLEX)

## 2023-11-18 LAB — CBC WITH DIFFERENTIAL/PLATELET
Abs Immature Granulocytes: 0.1 10*3/uL — ABNORMAL HIGH (ref 0.00–0.07)
Basophils Absolute: 0.1 10*3/uL (ref 0.0–0.1)
Basophils Relative: 0 %
Eosinophils Absolute: 0 10*3/uL (ref 0.0–0.5)
Eosinophils Relative: 0 %
HCT: 43.3 % (ref 39.0–52.0)
Hemoglobin: 15.1 g/dL (ref 13.0–17.0)
Immature Granulocytes: 1 %
Lymphocytes Relative: 5 %
Lymphs Abs: 0.8 10*3/uL (ref 0.7–4.0)
MCH: 30.9 pg (ref 26.0–34.0)
MCHC: 34.9 g/dL (ref 30.0–36.0)
MCV: 88.7 fL (ref 80.0–100.0)
Monocytes Absolute: 1.1 10*3/uL — ABNORMAL HIGH (ref 0.1–1.0)
Monocytes Relative: 6 %
Neutro Abs: 14.6 10*3/uL — ABNORMAL HIGH (ref 1.7–7.7)
Neutrophils Relative %: 88 %
Platelets: 184 10*3/uL (ref 150–400)
RBC: 4.88 MIL/uL (ref 4.22–5.81)
RDW: 11.9 % (ref 11.5–15.5)
WBC: 16.9 10*3/uL — ABNORMAL HIGH (ref 4.0–10.5)
nRBC: 0 % (ref 0.0–0.2)

## 2023-11-18 LAB — RESP PANEL BY RT-PCR (RSV, FLU A&B, COVID)  RVPGX2
Influenza A by PCR: NEGATIVE
Influenza B by PCR: NEGATIVE
Resp Syncytial Virus by PCR: NEGATIVE
SARS Coronavirus 2 by RT PCR: NEGATIVE

## 2023-11-18 LAB — LIPASE, BLOOD: Lipase: 26 U/L (ref 11–51)

## 2023-11-18 LAB — LACTIC ACID, PLASMA: Lactic Acid, Venous: 1.8 mmol/L (ref 0.5–1.9)

## 2023-11-18 MED ORDER — KETOROLAC TROMETHAMINE 15 MG/ML IJ SOLN
15.0000 mg | Freq: Once | INTRAMUSCULAR | Status: AC
  Administered 2023-11-18: 15 mg via INTRAVENOUS
  Filled 2023-11-18: qty 1

## 2023-11-18 MED ORDER — ONDANSETRON HCL 4 MG/2ML IJ SOLN
4.0000 mg | Freq: Four times a day (QID) | INTRAMUSCULAR | Status: DC | PRN
  Administered 2023-11-20: 4 mg via INTRAVENOUS
  Filled 2023-11-18: qty 2

## 2023-11-18 MED ORDER — LEVOFLOXACIN IN D5W 750 MG/150ML IV SOLN
750.0000 mg | Freq: Once | INTRAVENOUS | Status: AC
  Administered 2023-11-18: 750 mg via INTRAVENOUS
  Filled 2023-11-18: qty 150

## 2023-11-18 MED ORDER — LEVOFLOXACIN IN D5W 750 MG/150ML IV SOLN: 750.0000 mg | INTRAVENOUS | Status: DC

## 2023-11-18 MED ORDER — ACETAMINOPHEN 650 MG RE SUPP: 650.0000 mg | Freq: Four times a day (QID) | RECTAL | Status: DC | PRN

## 2023-11-18 MED ORDER — ACETAMINOPHEN 500 MG PO TABS
1000.0000 mg | ORAL_TABLET | Freq: Once | ORAL | Status: AC
  Administered 2023-11-18: 1000 mg via ORAL
  Filled 2023-11-18: qty 2

## 2023-11-18 MED ORDER — ONDANSETRON HCL 4 MG/2ML IJ SOLN
4.0000 mg | Freq: Once | INTRAMUSCULAR | Status: AC
Start: 2023-11-18 — End: 2023-11-18
  Administered 2023-11-18: 4 mg via INTRAVENOUS
  Filled 2023-11-18: qty 2

## 2023-11-18 MED ORDER — LEVOFLOXACIN IN D5W 750 MG/150ML IV SOLN
750.0000 mg | INTRAVENOUS | Status: DC
  Administered 2023-11-19: 750 mg via INTRAVENOUS
  Filled 2023-11-18: qty 150

## 2023-11-18 MED ORDER — LACTATED RINGERS IV BOLUS
1000.0000 mL | Freq: Once | INTRAVENOUS | Status: AC
  Administered 2023-11-18: 1000 mL via INTRAVENOUS

## 2023-11-18 MED ORDER — METOCLOPRAMIDE HCL 5 MG/ML IJ SOLN: 5.0000 mg | Freq: Four times a day (QID) | INTRAMUSCULAR | Status: DC | PRN

## 2023-11-18 MED ORDER — ACETAMINOPHEN 325 MG PO TABS: 650.0000 mg | ORAL_TABLET | Freq: Four times a day (QID) | ORAL | Status: DC | PRN

## 2023-11-18 MED ORDER — TRAMADOL HCL 50 MG PO TABS
50.0000 mg | ORAL_TABLET | Freq: Four times a day (QID) | ORAL | Status: DC | PRN
  Administered 2023-11-19: 50 mg via ORAL
  Filled 2023-11-18: qty 1

## 2023-11-18 MED ORDER — LACTATED RINGERS IV SOLN
INTRAVENOUS | Status: AC
Start: 2023-11-18 — End: 2023-11-19

## 2023-11-18 MED ORDER — PROCHLORPERAZINE EDISYLATE 10 MG/2ML IJ SOLN
10.0000 mg | Freq: Once | INTRAMUSCULAR | Status: AC
  Administered 2023-11-18: 10 mg via INTRAVENOUS
  Filled 2023-11-18: qty 2

## 2023-11-18 MED ORDER — DIPHENHYDRAMINE HCL 50 MG/ML IJ SOLN
25.0000 mg | Freq: Once | INTRAMUSCULAR | Status: AC
  Administered 2023-11-18: 25 mg via INTRAVENOUS
  Filled 2023-11-18: qty 1

## 2023-11-18 MED ORDER — KETOROLAC TROMETHAMINE 15 MG/ML IJ SOLN
15.0000 mg | Freq: Four times a day (QID) | INTRAMUSCULAR | Status: DC | PRN
  Administered 2023-11-19: 15 mg via INTRAVENOUS
  Filled 2023-11-18 (×2): qty 1

## 2023-11-18 MED ORDER — METRONIDAZOLE 500 MG/100ML IV SOLN
500.0000 mg | Freq: Two times a day (BID) | INTRAVENOUS | Status: DC
  Administered 2023-11-18 – 2023-11-19 (×2): 500 mg via INTRAVENOUS
  Filled 2023-11-18 (×2): qty 100

## 2023-11-18 MED ORDER — IOHEXOL 300 MG/ML  SOLN
100.0000 mL | Freq: Once | INTRAMUSCULAR | Status: AC | PRN
  Administered 2023-11-18: 100 mL via INTRAVENOUS

## 2023-11-18 MED ORDER — FENTANYL CITRATE PF 50 MCG/ML IJ SOSY: 12.5000 ug | PREFILLED_SYRINGE | INTRAMUSCULAR | Status: DC | PRN

## 2023-11-18 NOTE — Assessment & Plan Note (Signed)
 Possibly secondary to orthostasis in the setting of dehydration For completion obtain troponin and echogram.  Patient denies hitting head Has chronic migraines

## 2023-11-18 NOTE — Assessment & Plan Note (Signed)
Now in remission. 

## 2023-11-18 NOTE — Assessment & Plan Note (Signed)
 Rehydrate Provide pain management Patient reports chronic recurrent migraines for which she takes Excedrin May have rebound headaches would recommend follow-up with neurology

## 2023-11-18 NOTE — ED Triage Notes (Addendum)
 Pt arrives from home after being at [facility] this morning. Pt states that he was told he had a kidney infection and if he got worse to come to ED. Pt reports nausea and increased pain at home. Pt had episode of passing out at work this morning prior to going to UC.

## 2023-11-18 NOTE — ED Notes (Signed)
 Pt. To CT and just returned

## 2023-11-18 NOTE — Plan of Care (Signed)
 Plan of Care Note for accepted transfer   Patient name: Michael Bullock ZOX:[id number] DOB: 27-Mar-1996  Facility requesting transfer: [facility] ED Requesting Provider: Rexie Catena, PA-C  Facility course: 28 year old male with history of Ewing sarcoma, goiter, growth hormone deficiency, gynecomastia, migraines presented to the ED with complaints of abdominal/flank pain, dysuria, and an episode of syncope earlier today.  Tachycardic on arrival but afebrile.  SBP initially in the 90s.  EKG showing sinus tachycardia.  Labs notable for WBC count 16.9, creatinine 1.3, lactate normal.  UA with negative nitrate, small amount of leukocytes, and microscopy showing 21-50 WBCs and few bacteria.  Urine and blood cultures in process.  CT abdomen pelvis negative for acute findings. Patient was given Tylenol , Benadryl , Toradol , Zofran, Compazine, 2 L IV fluids, Levaquin, and metronidazole.  Tachycardia and blood pressure improved after IV fluids.  Plan of care: The patient is accepted for admission to Telemetry unit at [facility].  [facility] will assume care on arrival to accepting facility. Until arrival, care as per EDP. However, [facility] available 24/7 for questions and assistance.  Check [email or web address] for on-call coverage.  Nursing staff, please call [facility] Admits & Consults System-Wide number under Amion on patient's arrival so appropriate admitting provider can evaluate the pt.

## 2023-11-18 NOTE — Sepsis Progress Note (Signed)
 Code sepsis protocol being monitored by eLink.

## 2023-11-18 NOTE — Assessment & Plan Note (Signed)
 Rehydrate Foley will check orthostatics prior to discharge

## 2023-11-18 NOTE — ED Notes (Signed)
 Pt up on side of bed to pee

## 2023-11-18 NOTE — H&P (Signed)
 Delynn Marana ZOX:[id number] DOB: Sep 18, 1995 DOA: 11/18/2023     PCP: System, Provider Not In   Outpatient Specialists:     Oncology Arline Laity, PA-C   [facility]     Patient arrived to ER on 11/18/23 at 1433 Referred by Attending No att. providers found   Patient coming from:    home Lives  With family    Chief Complaint:   Chief Complaint  Patient presents with   Emesis   Loss of Consciousness    HPI: Michael Bullock is a 28 y.o. male with medical history significant of Ewing sarcoma in remission, goiter, growth hormone deficiency gynecomastia migraines    Presented with nausea vomiting flank pain Patient reports nausea and increased pain this morning had an episode of passing out Has known history of Ewing sarcoma of the left femur in remission Has been having dysuria starting yesterday And an abdominal pain spreading to bilateral lower back fevers and chills at home body aches headache At urgent care he was told he has pyelonephritis started on Bactrim Despite taking 1 dose of antibiotics his symptoms continue to progress Prestyloid had nausea and vomiting and went to emergency department. Has bilateral CVA tenderness.  White blood cell count elevated 16.9    This morning was standing up at the meeting felt light headed and fainted has not been eating or drinking Felt better as soon as he was down was able to get up right away, highest temp today was 99  No CP, no SOB No hx of seizures no seizure activity  Denies hitting head  Drinks caffeine on regular basis but has not had any today  Trigger for his headache   Denies significant ETOH intake   Does not smoke   Denies marijuana use      Regarding pertinent Chronic problems:      Cancer: Ewing sarcoma sp     While in ER: Clinical Course as of 11/18/23 2238  Mon Nov 18, 2023  1532 Patient tachycardic, has elevated white blood cell count, and given urgent care was concerned  for pyelonephritis, seems he may be septic from this. Activated code sepsis. Will treat for presumed urinary source of infection, still awaiting his urine results.  [AF]  1826 Patient reporting headache, has already received Tylenol  and Toradol .  Will add on Compazine and Benadryl  [AF]    Clinical Course User Index [AF] Rexie Catena, PA-C   Initialy tachycardic Urine and blood cultures pending CT abdomen pelvis nonacute Patient started Levaquin and metronidazole Vitals improved after IV fluids  Lab Orders         Resp panel by RT-PCR (RSV, Flu A&B, Covid) Anterior Nasal Swab         Blood culture (routine x 2)         Urine Culture         CBC with Differential         Comprehensive metabolic panel         Lipase, blood         Lactic acid, plasma         Urinalysis, Routine w reflex microscopic -Urine, Clean Catch         Lactic acid, plasma         Urinalysis, Microscopic (reflex)      CTabd/pelvis -  non-acute    Following Medications were ordered in ER: Medications  lactated ringers infusion ( Intravenous New Bag/Given 11/18/23 1820)  metroNIDAZOLE (  FLAGYL) IVPB 500 mg (0 mg Intravenous Stopped 11/18/23 1842)  ketorolac  (TORADOL ) 15 MG/ML injection 15 mg (has no administration in time range)  ondansetron (ZOFRAN) injection 4 mg (has no administration in time range)  ondansetron (ZOFRAN) injection 4 mg (4 mg Intravenous Given 11/18/23 1518)  lactated ringers bolus 1,000 mL (0 mLs Intravenous Stopped 11/18/23 1648)  lactated ringers bolus 1,000 mL (0 mLs Intravenous Stopped 11/18/23 1809)  levofloxacin (LEVAQUIN) IVPB 750 mg (0 mg Intravenous Stopped 11/18/23 1842)  ketorolac  (TORADOL ) 15 MG/ML injection 15 mg (15 mg Intravenous Given 11/18/23 1626)  iohexol (OMNIPAQUE) 300 MG/ML solution 100 mL (100 mLs Intravenous Contrast Given 11/18/23 1609)  acetaminophen  (TYLENOL ) tablet 1,000 mg (1,000 mg Oral Given 11/18/23 1743)  prochlorperazine (COMPAZINE) injection 10 mg (10 mg  Intravenous Given 11/18/23 1845)  diphenhydrAMINE  (BENADRYL ) injection 25 mg (25 mg Intravenous Given 11/18/23 1850)     ED Triage Vitals  Encounter Vitals Group     BP 11/18/23 1441 91/77     Systolic BP Percentile --      Diastolic BP Percentile --      Pulse Rate 11/18/23 1441 (!) 118     Resp 11/18/23 1441 (!) 24     Temp 11/18/23 1441 98.1 F (36.7 C)     Temp Source 11/18/23 1744 Oral     SpO2 11/18/23 1441 100 %     Weight 11/18/23 1440 160 lb (72.6 kg)     Height 11/18/23 1440 5\' 10"  (1.778 m)     Head Circumference --      Peak Flow --      Pain Score 11/18/23 1440 8     Pain Loc --      Pain Education --      Exclude from Growth Chart --   UXLK(44)@     _________________________________________ Significant initial  Findings: Abnormal Labs Reviewed  CBC WITH DIFFERENTIAL/PLATELET - Abnormal; Notable for the following components:      Result Value   WBC 16.9 (*)    Neutro Abs 14.6 (*)    Monocytes Absolute 1.1 (*)    Abs Immature Granulocytes 0.10 (*)    All other components within normal limits  COMPREHENSIVE METABOLIC PANEL WITH GFR - Abnormal; Notable for the following components:   Glucose, Bld 111 (*)    Creatinine, Ser 1.33 (*)    All other components within normal limits  URINALYSIS, ROUTINE W REFLEX MICROSCOPIC - Abnormal; Notable for the following components:   pH 8.5 (*)    Protein, ur 30 (*)    Leukocytes,Ua SMALL (*)    All other components within normal limits  URINALYSIS, MICROSCOPIC (REFLEX) - Abnormal; Notable for the following components:   Bacteria, UA FEW (*)    All other components within normal limits        ECG: Ordered Personally reviewed and interpreted by me showing: HR : 119 Rhythm:Sinus tachycardia Right atrial enlargement Rightward axis Pulmonary disease pattern Abnormal ECG No previous ECGs available QTC 433    The recent clinical data is shown below. Vitals:   11/18/23 1830 11/18/23 2045 11/18/23 2130 11/18/23 2235  BP:  114/73 112/69 121/85 102/71  Pulse: 92 85 (!) 106 93  Resp: 20 17 17 18   Temp:   98.9 F (37.2 C) 100.1 F (37.8 C)  TempSrc:   Oral Oral  SpO2: 99% 98% 100% 100%  Weight:      Height:        WBC     Component  Value Date/Time   WBC 16.9 (H) 11/18/2023 1510   LYMPHSABS 0.8 11/18/2023 1510   MONOABS 1.1 (H) 11/18/2023 1510   EOSABS 0.0 11/18/2023 1510   BASOSABS 0.1 11/18/2023 1510    Lactic Acid, Venous    Component Value Date/Time   LATICACIDVEN 1.8 11/18/2023 1510      UA   evidence of UTI ?      Urine analysis:    Component Value Date/Time   COLORURINE YELLOW 11/18/2023 1656   APPEARANCEUR CLEAR 11/18/2023 1656   LABSPEC 1.015 11/18/2023 1656   PHURINE 8.5 (H) 11/18/2023 1656   GLUCOSEU NEGATIVE 11/18/2023 1656   HGBUR NEGATIVE 11/18/2023 1656   BILIRUBINUR NEGATIVE 11/18/2023 1656   KETONESUR NEGATIVE 11/18/2023 1656   PROTEINUR 30 (A) 11/18/2023 1656   UROBILINOGEN 2.0 (H) 03/30/2014 1654   NITRITE NEGATIVE 11/18/2023 1656   LEUKOCYTESUR SMALL (A) 11/18/2023 1656    Results for orders placed or performed during the hospital encounter of 11/18/23  Resp panel by RT-PCR (RSV, Flu A&B, Covid) Anterior Nasal Swab     Status: None   Collection Time: 11/18/23  3:10 PM   Specimen: Anterior Nasal Swab  Result Value Ref Range Status   SARS Coronavirus 2 by RT PCR NEGATIVE NEGATIVE Final         Influenza A by PCR NEGATIVE NEGATIVE Final   Influenza B by PCR NEGATIVE NEGATIVE Final         Resp Syncytial Virus by PCR NEGATIVE NEGATIVE Final          ABX started Antibiotics Given (last 72 hours)     Date/Time Action Medication Dose Rate   11/18/23 1710 New Bag/Given   levofloxacin (LEVAQUIN) IVPB 750 mg 750 mg 100 mL/hr   11/18/23 1739 New Bag/Given   metroNIDAZOLE (FLAGYL) IVPB 500 mg 500 mg 100 mL/hr       __________________________________________________________ Recent Labs  Lab 11/18/23 1510  NA 136  K 3.7  CO2 24  GLUCOSE 111*  BUN 13   CREATININE 1.33*  CALCIUM 10.2    Cr  Up from baseline see below Lab Results  Component Value Date   CREATININE 1.33 (H) 11/18/2023   CREATININE 0.90 03/30/2014    Recent Labs  Lab 11/18/23 1510  AST 29  ALT 38  ALKPHOS 114  BILITOT 0.6  PROT 7.5  ALBUMIN 4.9   Lab Results  Component Value Date   CALCIUM 10.2 11/18/2023    Plt: Lab Results  Component Value Date   PLT 184 11/18/2023       Recent Labs  Lab 11/18/23 1510  WBC 16.9*  NEUTROABS 14.6*  HGB 15.1  HCT 43.3  MCV 88.7  PLT 184    HG/HCT   stable,     Component Value Date/Time   HGB 15.1 11/18/2023 1510   HCT 43.3 11/18/2023 1510   MCV 88.7 11/18/2023 1510     Recent Labs  Lab 11/18/23 1510  LIPASE 26     _______________________________________________ Hospitalist was called for admission for   Pyelonephritis    The following Work up has been ordered so far:  Orders Placed This Encounter  Procedures   Critical Care   Resp panel by RT-PCR (RSV, Flu A&B, Covid) Anterior Nasal Swab   Blood culture (routine x 2)   Urine Culture   CT ABDOMEN PELVIS W CONTRAST   CBC with Differential   Comprehensive metabolic panel   Lipase, blood   Lactic acid, plasma   Urinalysis, Routine w  reflex microscopic -Urine, Clean Catch   Lactic acid, plasma   Urinalysis, Microscopic (reflex)   DO NOT delay antibiotics if unable to obtain blood culture.   Cardiac Monitoring Continuous x 24 hours Indications for use: Other; Other indications for use: Close monitoring   Code Sepsis activation.  This occurs automatically when order is signed and prioritizes pharmacy, lab, and radiology services for STAT collections and interventions.  If [facility] downtime, call [facility] [phone]) to activate Code Sepsis.   monitoring by pharmacy   Consult to hospitalist   EKG 12-Lead   EKG   EKG   Insert peripheral IV   Insert 2nd peripheral IV if not already present.   Place in observation (patient's expected length of  stay will be less than 2 midnights)     OTHER Significant initial  Findings:  labs showing:     DM  labs:  HbA1C: No results for input(s): "HGBA1C" in the last 8760 hours.     CBG (last 3)  No results for input(s): "GLUCAP" in the last 72 hours.        Cultures:    Component Value Date/Time   SDES URINE, CLEAN CATCH 03/30/2014 1654   SPECREQUEST NONE 03/30/2014 1654   CULT  03/30/2014 1654    Multiple bacterial morphotypes present, none predominant. Suggest appropriate recollection if clinically indicated. Performed at [facility]   REPTSTATUS 03/31/2014 FINAL 03/30/2014 1654     Radiological Exams on Admission: CT ABDOMEN PELVIS W CONTRAST Result Date: 11/18/2023 CLINICAL DATA:  Abdominal pain, acute, nonlocalized EXAM: CT ABDOMEN AND PELVIS WITH CONTRAST TECHNIQUE: Multidetector CT imaging of the abdomen and pelvis was performed using the standard protocol following bolus administration of intravenous contrast. RADIATION DOSE REDUCTION: This exam was performed according to the departmental dose-optimization program which includes automated exposure control, adjustment of the mA and/or kV according to patient size and/or use of iterative reconstruction technique. CONTRAST:  100mL OMNIPAQUE IOHEXOL 300 MG/ML  SOLN COMPARISON:  None Available. FINDINGS: Lower chest: No acute abnormality. Hepatobiliary: No focal liver abnormality is seen. No gallstones, gallbladder wall thickening, or biliary dilatation. Pancreas: Unremarkable. No pancreatic ductal dilatation or surrounding inflammatory changes. Spleen: Normal in size without focal abnormality. Adrenals/Urinary Tract: Adrenal glands are unremarkable. Kidneys are normal, without renal calculi, focal lesion, or hydronephrosis. Bladder is unremarkable. Stomach/Bowel: Stomach is within normal limits. Appendix appears normal. No evidence of bowel wall thickening, distention, or inflammatory changes. Vascular/Lymphatic: No significant  vascular findings are present. No enlarged abdominal or pelvic lymph nodes. Reproductive: Prostate is unremarkable. Other: No abdominal wall hernia or abnormality. No abdominopelvic ascites. 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 Scattered sclerotic foci, most notable in the left iliac bone which most likely represent bone islands. IMPRESSION: 1. No evidence of appendicitis or acute process. Electronically Signed   By: Reagan Camera M.D.   On: 11/18/2023 17:27   _______________________________________________________________________________________________________ Latest  Blood pressure 102/71, pulse 93, temperature 100.1 F (37.8 C), temperature source Oral, resp. rate 18, height 5\' 10"  (1.778 m), weight 72.6 kg, SpO2 100%.   Vitals  labs and radiology finding personally reviewed  Review of Systems:    Pertinent positives include:    Fevers, chills, fatigue nausea, vomiting,  flank pain.  Constitutional:  No weight loss, night sweats,, weight loss  HEENT:  No headaches, Difficulty swallowing,Tooth/dental problems,Sore throat,  No sneezing, itching, ear ache, nasal congestion, post nasal drip,  Cardio-vascular:  No chest pain, Orthopnea, PND, anasarca, dizziness, palpitations.no Bilateral  lower extremity swelling  GI:  No heartburn, indigestion, abdominal pain, diarrhea, change in bowel habits, loss of appetite, melena, blood in stool, hematemesis Resp:  no shortness of breath at rest. No dyspnea on exertion, No excess mucus, no productive cough, No non-productive cough, No coughing up of blood.No change in color of mucus.No wheezing. Skin:  no rash or lesions. No jaundice GU:  no dysuria, change in color of urine, no urgency or frequency. No straining to urinate.  No Musculoskeletal:  No joint pain or no joint swelling. No decreased range of motion. No back pain.  Psych:  No change in mood or affect. No depression or anxiety. No memory loss.   Neuro: no localizing neurological complaints, no tingling, no weakness, no double vision, no gait abnormality, no slurred speech, no confusion  All systems reviewed and apart from HOPI all are negative _______________________________________________________________________________________________ Past Medical History:   Past Medical History:  Diagnosis Date   Ewing sarcoma (HCC)    Goiter    Growth hormone deficiency (human) (HCC)    Gynecomastia, male    Hypoglycemia    Impaired glucose tolerance    Migraines    Physical growth delay    Polyuria    Puberty delay      Past Surgical History:  Procedure Laterality Date   HYPOSPADIAS CORRECTION      Social History:  Ambulatory   independently     reports that he has never smoked. He has never used smokeless tobacco. He reports current drug use. He reports that he does not drink alcohol.     Family History:   Family History  Problem Relation Age of Onset   Thyroid  disease Mother    Diabetes Maternal Grandmother    Thyroid  disease Maternal Grandmother    Cancer Maternal Grandmother    ______________________________________________________________________________________________ Allergies: Allergies  Allergen Reactions   Penicillins Anaphylaxis   Oxycodone     "Makes my skin crawl"     Prior to Admission medications   Medication Sig Start Date End Date Taking? Authorizing Provider  LORazepam (ATIVAN) 1 MG tablet Take 1 mg by mouth every 8 (eight) hours.    [provider]  ondansetron (ZOFRAN-ODT) 8 MG disintegrating tablet Take 8 mg by mouth every 8 (eight) hours as needed for nausea or vomiting.    [provider]  traMADol (ULTRAM) 50 MG tablet Take by mouth every 6 (six) hours as needed.    [provider]    ___________________________________________________________________________________________________ Physical Exam:    11/18/2023   10:35 PM 11/18/2023    9:30 PM 11/18/2023     8:45 PM  Vitals with BMI  Systolic 102 121 409  Diastolic 71 85 69  Pulse 93 106 85     1. General:  in No  Acute distress   Chronically ill  -appearing 2. Psychological: Alert and   Oriented 3. Head/ENT:   Dry Mucous Membranes                          Head Non traumatic, neck supple                          Normal  Poor Dentition 4. SKIN: decreased Skin turgor,  Skin clean Dry and intact no rash    5. Heart: Regular rate and rhythm no  Murmur, no Rub or gallop 6. Lungs:   , no wheezes or crackles   7.  Abdomen: Soft,  non-tender, Non distended bowel sounds present 8. Lower extremities: no clubbing, cyanosis, no  edema 9. Neurologically Grossly intact, moving all 4 extremities equally  10. MSK: Normal range of motion    Chart has been reviewed  ______________________________________________________________________________________________  Assessment/Plan  28 y.o. male with medical history significant of Ewing sarcoma in remission, goiter, growth hormone deficiency gynecomastia migraines   Admitted for  Pyelonephritis dehydration and syncope   Present on Admission:  UTI (urinary tract infection)  Dehydration  AKI (acute kidney injury) (HCC)  Syncope  Migraine  Ewing sarcoma (HCC)    UTI (urinary tract infection) Versus pyelonephritis. Patient was started on Levaquin Allergic to penicillin anaphylaxis Seems like in 2015 he did receive a dose of cefepime  For now continue Levaquin per pharmacy Await results of urine culture   Dehydration Rehydrate Foley will check orthostatics prior to discharge  AKI (acute kidney injury) (HCC) Mild in the setting of dehydration will rehydrate and follow  Syncope Possibly secondary to orthostasis in the setting of dehydration For completion obtain troponin and echogram.  Patient denies hitting head Has chronic migraines  Migraine Rehydrate Provide pain management Patient reports chronic recurrent migraines for which she  takes Excedrin May have rebound headaches would recommend follow-up with neurology  Ewing sarcoma (HCC) Now in remission.   Other plan as per orders.  DVT prophylaxis:  SCD      Code Status:    Code Status: Not on file FULL CODE  as per patient   I had personally discussed CODE STATUS with patient   ACP   none    Family Communication:   Family not at  Bedside    Diet heart healthy   Disposition Plan:       To home once workup is complete and patient is stable   Following barriers for discharge:                                                         Electrolytes corrected                                                              Afebrile, white count improving able to transition to PO antibiotics                                 Consult Orders  (From admission, onward)           Start     Ordered   11/18/23 1815  Consult to hospitalist  Called [facility] Link for consult talked to Tequila at 6:17  Once       Provider:  (Not yet assigned)  Question Answer Comment  Place call to: [facility]   Reason for Consult Admit      11/18/23 1814               Consults called: none     Admission status:  ED Disposition     ED Disposition  Admit   Condition  --   Comment  Hospital Area:  [facility] [[id number]]  Level of Care: Telemetry [5]  Admit to tele based on following criteria: Other see comments  Comments: Close monitoring  Interfacility transfer: Yes  May place patient in observation at [facility] or [facility] if equivalent level of care is available:: Yes  Covid Evaluation: Asymptomatic - no recent exposure (last 10 days) testing not required  Diagnosis: UTI (urinary tract infection) [161096]  Admitting Physician: Marcellus Sers [[id number]]  Attending Physician: Sallyanne Creamer [[id number]]           Obs      Level of care     tele  For 12H   Michael Bullock 11/18/2023, 11:30 PM    [facility]     after 2  AM please page floor coverage   If 7AM-7PM, please contact the day team taking care of the patient using [email or web address]

## 2023-11-18 NOTE — ED Notes (Signed)
 Family updated as to patient's status by PA C

## 2023-11-18 NOTE — ED Notes (Signed)
 Pt. Is resting with abx. Going x 2.  Pt. Is alert and oriented with father at bedside.

## 2023-11-18 NOTE — Subjective & Objective (Signed)
 Patient reports nausea and increased pain this morning had an episode of passing out Has known history of Ewing sarcoma of the left femur in remission Has been having dysuria starting yesterday And an abdominal pain spreading to bilateral lower back fevers and chills at home body aches headache At urgent care he was told he has pyelonephritis started on Bactrim Despite taking 1 dose of antibiotics his symptoms continue to progress Prestyloid had nausea and vomiting and went to emergency department. Has bilateral CVA tenderness.  White blood cell count elevated 16.9

## 2023-11-18 NOTE — ED Notes (Signed)
 Vital signs stable.

## 2023-11-18 NOTE — Assessment & Plan Note (Signed)
 Versus pyelonephritis. Patient was started on Levaquin Allergic to penicillin anaphylaxis Seems like in 2015 he did receive a dose of cefepime  For now continue Levaquin per pharmacy Await results of urine culture

## 2023-11-18 NOTE — ED Provider Notes (Signed)
 [place] [facility] Provider Note   CSN: [id number] Arrival date & time: 11/18/23  1433     History  Chief Complaint  Patient presents with   Emesis   Loss of Consciousness    Michael Bullock is a 28 y.o. 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  States he then developed generalized abdominal pain yesterday that has spread into his bilateral lower back this morning. Also reports subjective fever and chills at home, body aches, and headache.  He went to urgent care and was told he may have pyelonephritis and was started on Bactrim.  He took 1 dose of this antibiotic this morning, but pain and nausea have continued to worsen, prompting him to come to the ER.   Emesis Loss of Consciousness Associated symptoms: vomiting        Home Medications Prior to Admission medications   Medication Sig Start Date End Date Taking? Authorizing Provider  LORazepam (ATIVAN) 1 MG tablet Take 1 mg by mouth every 8 (eight) hours.    [provider]  ondansetron (ZOFRAN-ODT) 8 MG disintegrating tablet Take 8 mg by mouth every 8 (eight) hours as needed for nausea or vomiting.    [provider]  traMADol (ULTRAM) 50 MG tablet Take by mouth every 6 (six) hours as needed.    [provider]      Allergies    Penicillins and Oxycodone    Review of Systems   Review of Systems  Cardiovascular:  Positive for syncope.  Gastrointestinal:  Positive for vomiting.    Physical Exam Updated Vital Signs BP 114/73   Pulse 92   Temp 99.7 F (37.6 C) (Oral)   Resp 20   Ht 5\' 10"  (1.778 m)   Wt 72.6 kg   SpO2 99%   BMI 22.96 kg/m  Physical Exam Vitals and nursing note reviewed.  Constitutional:      General: He is not in acute distress.    Appearance: He is well-developed. He is ill-appearing.     Comments:  No active vomiting  HENT:     Head: Normocephalic and atraumatic.  Eyes:     Conjunctiva/sclera: Conjunctivae normal.  Cardiovascular:     Rate and Rhythm: Regular rhythm. Tachycardia present.     Heart sounds: No murmur heard. Pulmonary:     Effort: Pulmonary effort is normal. No respiratory distress.     Breath sounds: Normal breath sounds.  Abdominal:     Palpations: Abdomen is soft.     Tenderness: There is no abdominal tenderness. There is right CVA tenderness and left CVA tenderness.     Comments: Abdomen is diffusely tender to palpation with rebound and guarding present   Musculoskeletal:        General: No swelling.     Cervical back: Neck supple.  Skin:    General: Skin is warm and dry.     Capillary Refill: Capillary refill takes less than 2 seconds.  Neurological:     Mental Status: He is alert.  Psychiatric:        Mood and Affect: Mood normal.     ED Results / Procedures / Treatments   Labs (all labs ordered are listed, but only abnormal results are displayed) Labs Reviewed  CBC WITH DIFFERENTIAL/PLATELET - Abnormal; Notable for the following components:      Result Value  WBC 16.9 (*)    Neutro Abs 14.6 (*)    Monocytes Absolute 1.1 (*)    Abs Immature Granulocytes 0.10 (*)    All other components within normal limits  COMPREHENSIVE METABOLIC PANEL WITH GFR - Abnormal; Notable for the following components:   Glucose, Bld 111 (*)    Creatinine, Ser 1.33 (*)    All other components within normal limits  URINALYSIS, ROUTINE W REFLEX MICROSCOPIC - Abnormal; Notable for the following components:   pH 8.5 (*)    Protein, ur 30 (*)    Leukocytes,Ua SMALL (*)    All other components within normal limits  URINALYSIS, MICROSCOPIC (REFLEX) - Abnormal; Notable for the following components:   Bacteria, UA FEW (*)    All other components within normal limits  RESP PANEL BY RT-PCR (RSV, FLU A&B, COVID)  RVPGX2  CULTURE, BLOOD (ROUTINE X 2)  CULTURE, BLOOD (ROUTINE  X 2)  URINE CULTURE  LIPASE, BLOOD  LACTIC ACID, PLASMA  LACTIC ACID, PLASMA    EKG None  Radiology CT ABDOMEN PELVIS W CONTRAST Result Date: 11/18/2023 CLINICAL DATA:  Abdominal pain, acute, nonlocalized EXAM: CT ABDOMEN AND PELVIS WITH CONTRAST TECHNIQUE: Multidetector CT imaging of the abdomen and pelvis was performed using the standard protocol following bolus administration of intravenous contrast. RADIATION DOSE REDUCTION: This exam was performed according to the departmental dose-optimization program which includes automated exposure control, adjustment of the mA and/or kV according to patient size and/or use of iterative reconstruction technique. CONTRAST:  100mL OMNIPAQUE IOHEXOL 300 MG/ML  SOLN COMPARISON:  None Available. FINDINGS: Lower chest: No acute abnormality. Hepatobiliary: No focal liver abnormality is seen. No gallstones, gallbladder wall thickening, or biliary dilatation. Pancreas: Unremarkable. No pancreatic ductal dilatation or surrounding inflammatory changes. Spleen: Normal in size without focal abnormality. Adrenals/Urinary Tract: Adrenal glands are unremarkable. Kidneys are normal, without renal calculi, focal lesion, or hydronephrosis. Bladder is unremarkable. Stomach/Bowel: Stomach is within normal limits. Appendix appears normal. No evidence of bowel wall thickening, distention, or inflammatory changes. Vascular/Lymphatic: No significant vascular findings are present. No enlarged abdominal or pelvic lymph nodes. Reproductive: Prostate is unremarkable. Other: No abdominal wall hernia or abnormality. No abdominopelvic ascites. 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 Scattered sclerotic foci, most notable in the left iliac bone which most likely represent bone islands. IMPRESSION: 1. No evidence of appendicitis or acute process. Electronically Signed   By: Reagan Camera M.D.   On: 11/18/2023 17:27    Procedures .Critical  Care  Performed by: Rexie Catena, PA-C Authorized by: Rexie Catena, PA-C   Critical care provider statement:    Critical care time (minutes):  40   Critical care was necessary to treat or prevent imminent or life-threatening deterioration of the following conditions:  Sepsis   Critical care was time spent personally by me on the following activities:  Development of treatment plan with patient or surrogate, discussions with consultants, evaluation of patient's response to treatment, examination of patient, ordering and review of laboratory studies, ordering and review of radiographic studies, ordering and performing treatments and interventions, pulse oximetry, re-evaluation of patient's condition and review of old charts   Care discussed with: admitting provider       Medications Ordered in ED Medications  lactated ringers infusion ( Intravenous New Bag/Given 11/18/23 1820)  metroNIDAZOLE (FLAGYL) IVPB 500 mg (0 mg Intravenous Stopped 11/18/23 1842)  ondansetron (ZOFRAN) injection 4 mg (4 mg Intravenous Given 11/18/23 1518)  lactated ringers bolus 1,000 mL (0  mLs Intravenous Stopped 11/18/23 1648)  lactated ringers bolus 1,000 mL (0 mLs Intravenous Stopped 11/18/23 1809)  levofloxacin (LEVAQUIN) IVPB 750 mg (0 mg Intravenous Stopped 11/18/23 1842)  ketorolac  (TORADOL ) 15 MG/ML injection 15 mg (15 mg Intravenous Given 11/18/23 1626)  iohexol (OMNIPAQUE) 300 MG/ML solution 100 mL (100 mLs Intravenous Contrast Given 11/18/23 1609)  acetaminophen  (TYLENOL ) tablet 1,000 mg (1,000 mg Oral Given 11/18/23 1743)  prochlorperazine (COMPAZINE) injection 10 mg (10 mg Intravenous Given 11/18/23 1845)  diphenhydrAMINE  (BENADRYL ) injection 25 mg (25 mg Intravenous Given 11/18/23 1850)    ED Course/ Medical Decision Making/ A&P Clinical Course as of 11/18/23 1957  Mon Nov 18, 2023  1532 Patient tachycardic, has elevated white blood cell count, and given urgent care was concerned for pyelonephritis,  seems he may be septic from this. Activated code sepsis. Will treat for presumed urinary source of infection, still awaiting his urine results.  [AF]  1826 Patient reporting headache, has already received Tylenol  and Toradol .  Will add on Compazine and Benadryl  [AF]    Clinical Course User Index [AF] Rexie Catena, PA-C                                 Medical Decision Making Amount and/or Complexity of Data Reviewed Labs: ordered. Radiology: ordered.  Risk OTC drugs. Prescription drug management.    Differential diagnosis includes but is not limited to Cholelithiasis, cholangitis, choledocholithiasis, peptic ulcer, gastritis, gastroenteritis, appendicitis, IBS, IBD, DKA, nephrolithiasis, UTI, pyelonephritis, pancreatitis, diverticulitis, mesenteric ischemia, abdominal aortic aneurysm, small bowel obstruction, volvulus, sepsis   ED Course:  Upon initial evaluation, patient does appear ill, wrapped in blankets.  Tachycardic initially upon arrival.  Abdomen very tender to palpation with rebound and guarding present.  CVA tenderness to palpation bilaterally.  Will order basic labs, urine, and CT abdomen pelvis for further evaluation.  Labs Ordered: I Ordered, and personally interpreted labs.  The pertinent results include:   CBC with leukocytosis of 16.9 CMP with elevated creatinine at 1.33, no elevation in LFTs Lipase within normal limits COVID, flu, RSV negative Urinalysis reveals leukocytes but no nitrites, proteinuria present  Imaging Studies ordered: I ordered imaging studies including CT abdomen pelvis I independently visualized the imaging with scope of interpretation limited to determining acute life threatening conditions related to emergency care. Imaging showed no acute abnormalities I agree with the radiologist interpretation   Cardiac Monitoring: / EKG: The patient was maintained on a cardiac monitor.  I personally viewed and interpreted the cardiac monitored  which showed an underlying rhythm of: Sinus tachycardia   Consultations Obtained: I requested consultation with the hospitalist Dr. Michell Ahumada,  and discussed lab and imaging findings as well as pertinent plan - they recommend: admission for further management  Medications Given: 2L LR given for dehydration and sepsis Levofloxacin and metronidazole given for presumed intra-abdominal source of infection upon arrival Benadryl , Compazine, Tylenol , Toradol  given for headache and abdominal pain  3:32 PM upon re-evaluation, patient still appears asking for pain medication.  Will add on Toradol .  Given tachycardia, leukocytosis, and concern for pyelonephritis at urgent care, we will start patient sepsis order set for suspected UTI/intra-abdominal source of infection.  Still waiting urine and CT abdomen pelvis. 7:39 PM.  Upon reevaluation, patient no longer tachycardic after fluids, heart rate in the 90s.  Labs unremarkable other than leukocytosis, elevated creatinine, and leukocytes on urine.  No abnormalities on CT abdomen pelvis to suggest other source  of infection.  Suspect he has UTI and pyelonephritis as cause of his symptoms and sepsis.  Headache improved with medications.  Discussed that he has been admitted to the hospital.  Stable for transfer.    Impression: Pyelonephritis Sepsis  Disposition:  Admission with hospitalist Dr. Michell Ahumada   Record Review: External records from outside source obtained and reviewed including oncology notes, patient intermission from sarcoma     This chart was dictated using voice recognition software, Dragon. Despite the best efforts of this provider to proofread and correct errors, errors may still occur which can change documentation meaning.          Final Clinical Impression(s) / ED Diagnoses Final diagnoses:  Pyelonephritis    Rx / DC Orders ED Discharge Orders     None         Rexie Catena, PA-C 11/18/23 1957    Sallyanne Creamer, DO 11/20/23 1451

## 2023-11-18 NOTE — Assessment & Plan Note (Signed)
 Mild in the setting of dehydration will rehydrate and follow

## 2023-11-19 ENCOUNTER — Observation Stay (HOSPITAL_COMMUNITY)

## 2023-11-19 DIAGNOSIS — N179 Acute kidney failure, unspecified: Secondary | ICD-10-CM | POA: Diagnosis not present

## 2023-11-19 DIAGNOSIS — R55 Syncope and collapse: Secondary | ICD-10-CM | POA: Diagnosis not present

## 2023-11-19 DIAGNOSIS — E860 Dehydration: Secondary | ICD-10-CM | POA: Diagnosis not present

## 2023-11-19 DIAGNOSIS — N390 Urinary tract infection, site not specified: Secondary | ICD-10-CM | POA: Diagnosis not present

## 2023-11-19 DIAGNOSIS — N12 Tubulo-interstitial nephritis, not specified as acute or chronic: Secondary | ICD-10-CM | POA: Diagnosis not present

## 2023-11-19 LAB — PHOSPHORUS: Phosphorus: 2.7 mg/dL (ref 2.5–4.6)

## 2023-11-19 LAB — CBC
HCT: 36.8 % — ABNORMAL LOW (ref 39.0–52.0)
Hemoglobin: 12.6 g/dL — ABNORMAL LOW (ref 13.0–17.0)
MCH: 32 pg (ref 26.0–34.0)
MCHC: 34.2 g/dL (ref 30.0–36.0)
MCV: 93.4 fL (ref 80.0–100.0)
Platelets: 128 10*3/uL — ABNORMAL LOW (ref 150–400)
RBC: 3.94 MIL/uL — ABNORMAL LOW (ref 4.22–5.81)
RDW: 12 % (ref 11.5–15.5)
WBC: 13.3 10*3/uL — ABNORMAL HIGH (ref 4.0–10.5)
nRBC: 0 % (ref 0.0–0.2)

## 2023-11-19 LAB — COMPREHENSIVE METABOLIC PANEL WITH GFR
ALT: 23 U/L (ref 0–44)
AST: 18 U/L (ref 15–41)
Albumin: 3.6 g/dL (ref 3.5–5.0)
Alkaline Phosphatase: 68 U/L (ref 38–126)
Anion gap: 5 (ref 5–15)
BUN: 11 mg/dL (ref 6–20)
CO2: 25 mmol/L (ref 22–32)
Calcium: 8.7 mg/dL — ABNORMAL LOW (ref 8.9–10.3)
Chloride: 104 mmol/L (ref 98–111)
Creatinine, Ser: 1.24 mg/dL (ref 0.61–1.24)
GFR, Estimated: >60 mL/min (ref >60)
Glucose, Bld: 106 mg/dL — ABNORMAL HIGH (ref 70–99)
Potassium: 3.7 mmol/L (ref 3.5–5.1)
Sodium: 134 mmol/L — ABNORMAL LOW (ref 135–145)
Total Bilirubin: 1.1 mg/dL (ref 0.0–1.2)
Total Protein: 6.1 g/dL — ABNORMAL LOW (ref 6.5–8.1)

## 2023-11-19 LAB — TROPONIN I (HIGH SENSITIVITY)
Troponin I (High Sensitivity): 6 ng/L (ref <18)
Troponin I (High Sensitivity): 3 ng/L (ref <18)

## 2023-11-19 LAB — ECHOCARDIOGRAM COMPLETE
AR max vel: 3.07 cm2
AV Area VTI: 3.06 cm2
AV Area mean vel: 3.14 cm2
AV Mean grad: 4 mmHg
AV Peak grad: 6.4 mmHg
Ao pk vel: 1.26 m/s
Area-P 1/2: 3.93 cm2
Height: 70 in
S' Lateral: 3.8 cm
Weight: 2560 [oz_av]

## 2023-11-19 LAB — D-DIMER, QUANTITATIVE: D-Dimer, Quant: 0.3 ug{FEU}/mL (ref 0.00–0.50)

## 2023-11-19 LAB — CK: Total CK: 36 U/L — ABNORMAL LOW (ref 49–397)

## 2023-11-19 LAB — MAGNESIUM: Magnesium: 1.8 mg/dL (ref 1.7–2.4)

## 2023-11-19 LAB — HIV ANTIBODY (ROUTINE TESTING W REFLEX): HIV Screen 4th Generation wRfx: NONREACTIVE

## 2023-11-19 LAB — TSH: TSH: 0.926 u[IU]/mL (ref 0.350–4.500)

## 2023-11-19 MED ORDER — SUMATRIPTAN SUCCINATE 50 MG PO TABS
50.0000 mg | ORAL_TABLET | ORAL | Status: DC | PRN
  Administered 2023-11-19 (×2): 50 mg via ORAL
  Filled 2023-11-19 (×4): qty 1

## 2023-11-19 MED ORDER — BUTALBITAL-APAP-CAFFEINE 50-325-40 MG PO TABS
1.0000 | ORAL_TABLET | Freq: Once | ORAL | Status: AC | PRN
  Administered 2023-11-19: 1 via ORAL
  Filled 2023-11-19: qty 1

## 2023-11-19 NOTE — Progress Notes (Signed)
 [facility] Progress Note  Patient: Michael Bullock     DUK:[id number]  DOA: 11/18/2023   PCP: System, Provider Not In       Brief hospital course: 28 year old with Ewing sarcoma of left femur in remission and history of headaches (?  Migraines) presents to the hospital with nausea, vomiting and syncope.  Complained of difficulty initiating micturition dysuria and pain across his lower back.  He went to urgent care where he was prescribed Bactrim.  He took a dose at this, continued to have nausea and presented to the hospital  Subjective:  Has a headache above his right eye for 2 days now with light sensitivity. Still having trouble initiating urination with burning during urination.   Assessment and Plan: Principal Problem:    Tachycardia, temp 100.2> sepsis due to UTI -  received a dose of Bactrim  on 5/12 in the AM prior to coming to the ED-urine culture growing 10,000 gram-negative rods - Blood cultures negative - Ongoing difficult initiating urination and dysuria but improving - currently receiving Levofloxacin and Flagyl- will dc Flagyl and continue levofloxacin - WBC 16.9> 13.3  Active Problems:   Vomiting   Syncope    Dehydration   AKI (acute kidney injury)  - improving with antibiotics, IVF - has not eaten anything - 2 D ECHO WNL  Headache-possible migraine - above right eye - Toradol  helped yesterday but it has come back - Fioricet @0409  did not help - try Imitrex today    Ewing sarcoma (HCC)  - left femur -  in remission     Code Status: Full Code Total time on patient care: 35 DVT prophylaxis:  SCDs Start: 11/18/23 2258     Objective:   Vitals:   11/18/23 2130 11/18/23 2235 11/19/23 0241 11/19/23 0653  BP: 121/85 102/71 113/67 113/75  Pulse: (!) 106 93 (!) 101 82  Resp: 17 18 18 18   Temp: 98.9 F (37.2 C) 100.1 F (37.8 C) 100.2 F (37.9 C) 98.8 F (37.1 C)  TempSrc: Oral Oral Oral Oral  SpO2: 100% 100% 96% 97%  Weight:      Height:        Filed Weights   11/18/23 1440  Weight: 72.6 kg   Exam: General exam: Appears comfortable  HEENT: oral mucosa moist Respiratory system: Clear to auscultation.  Cardiovascular system: S1 & S2 heard  Gastrointestinal system: Abdomen soft, non-tender, nondistended. Normal bowel sounds   Extremities: No cyanosis, clubbing or edema Psychiatry:  Mood & affect appropriate.    CBC: Recent Labs  Lab 11/18/23 1510 11/19/23 0539  WBC 16.9* 13.3*  NEUTROABS 14.6*  --   HGB 15.1 12.6*  HCT 43.3 36.8*  MCV 88.7 93.4  PLT 184 128*   Basic Metabolic Panel: Recent Labs  Lab 11/18/23 1510 11/19/23 0539  NA 136 134*  K 3.7 3.7  CL 98 104  CO2 24 25  GLUCOSE 111* 106*  BUN 13 11  CREATININE 1.33* 1.24  CALCIUM 10.2 8.7*  MG  --  1.8  PHOS  --  2.7     Scheduled Meds:  Imaging and lab data personally reviewed   Author: Sedalia Dacosta  11/19/2023 9:36 AM  To contact [facility]>   Check the care team in [facility] and look for the attending/consulting [facility] provider listed  Log into [email or web address] and use [place]'s universal password   Go to> "[facility]"  and find provider  If you still have difficulty reaching the provider, please page the  DOC (Director on Call) for the Hospitalists listed on amion

## 2023-11-19 NOTE — Plan of Care (Signed)

## 2023-11-19 NOTE — Progress Notes (Signed)
 Patient is feeling flushed and staring to feeling flank pain again in lower back.  Giving torodol Demarko Zeimet RN

## 2023-11-19 NOTE — Plan of Care (Signed)
  Problem: Education: Goal: Knowledge of General Education information will improve Description: Including pain rating scale, medication(s)/side effects and non-pharmacologic comfort measures 11/19/2023 1346 by Helen Loa, RN Outcome: Progressing 11/19/2023 1346 by Helen Loa, RN Outcome: Progressing   Problem: Clinical Measurements: Goal: Ability to maintain clinical measurements within normal limits will improve 11/19/2023 1346 by Helen Loa, RN Outcome: Progressing 11/19/2023 1346 by Helen Loa, RN Outcome: Progressing   Problem: Clinical Measurements: Goal: Will remain free from infection 11/19/2023 1346 by Helen Loa, RN Outcome: Progressing 11/19/2023 1346 by Helen Loa, RN Outcome: Progressing

## 2023-11-20 ENCOUNTER — Other Ambulatory Visit (HOSPITAL_COMMUNITY): Payer: Self-pay

## 2023-11-20 DIAGNOSIS — N12 Tubulo-interstitial nephritis, not specified as acute or chronic: Secondary | ICD-10-CM

## 2023-11-20 DIAGNOSIS — E860 Dehydration: Secondary | ICD-10-CM | POA: Diagnosis not present

## 2023-11-20 DIAGNOSIS — N3000 Acute cystitis without hematuria: Secondary | ICD-10-CM | POA: Diagnosis not present

## 2023-11-20 DIAGNOSIS — R55 Syncope and collapse: Secondary | ICD-10-CM

## 2023-11-20 DIAGNOSIS — N179 Acute kidney failure, unspecified: Secondary | ICD-10-CM | POA: Diagnosis not present

## 2023-11-20 LAB — URINE CULTURE: Culture: 10000 — AB

## 2023-11-20 MED ORDER — DIPHENHYDRAMINE HCL 25 MG PO CAPS
25.0000 mg | ORAL_CAPSULE | Freq: Once | ORAL | Status: AC | PRN
  Administered 2023-11-20: 25 mg via ORAL
  Filled 2023-11-20: qty 1

## 2023-11-20 MED ORDER — SULFAMETHOXAZOLE-TRIMETHOPRIM 800-160 MG PO TABS
1.0000 | ORAL_TABLET | Freq: Two times a day (BID) | ORAL | 0 refills | Status: AC
  Filled 2023-11-20: qty 14, 7d supply, fill #0

## 2023-11-20 MED ORDER — LEVOFLOXACIN IN D5W 500 MG/100ML IV SOLN: 500.0000 mg | INTRAVENOUS | Status: DC

## 2023-11-20 MED ORDER — LEVOFLOXACIN IN D5W 750 MG/150ML IV SOLN
750.0000 mg | INTRAVENOUS | Status: DC
  Filled 2023-11-20: qty 150

## 2023-11-20 NOTE — Plan of Care (Signed)

## 2023-11-20 NOTE — Discharge Summary (Signed)
 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  Patient diagnosed outpatient with UTI, had 1 dose of Bactrim before presenting to the hospital.  While here on IV antibiotics patient improved quite rapidly, given cultures and current findings were otherwise transition patient back to Bactrim to complete course, will extend course to 7 days given questionable pyelonephritis based on symptoms.  He is otherwise stable and agreeable for discharge home.   Discharge Diagnoses:  Principal Problem:   UTI (urinary tract infection) Active Problems:   Dehydration   AKI (acute kidney injury) (HCC)   Syncope   Migraine   Ewing sarcoma [facility])   Pyelonephritis    Discharge Instructions  Discharge Instructions     Call MD for:  difficulty breathing, headache or visual disturbances   Complete by: As directed    Call MD for:  extreme fatigue   Complete by: As directed    Call MD for:  hives   Complete by: As directed    Call MD for:  persistant dizziness or light-headedness   Complete by: As directed    Call MD for:  persistant nausea and vomiting   Complete by: As directed    Call MD for:  severe uncontrolled pain   Complete by: As directed    Call MD for:  temperature >100.4   Complete by: As directed    Diet - low sodium heart healthy   Complete by: As directed    Increase activity slowly   Complete by: As directed       Allergies  as of 11/20/2023       Reactions   Penicillins Anaphylaxis   Oxycodone Other (See Comments)   "Makes my skin crawl"        Medication List     TAKE these medications    sulfamethoxazole-trimethoprim 800-160 MG tablet Commonly known as: Bactrim DS Take 1 tablet by mouth 2 (two) times daily.        Allergies  Allergen Reactions   Penicillins Anaphylaxis   Oxycodone Other (See Comments)    "Makes my skin crawl"    Consultations: None  Procedures/Studies: ECHOCARDIOGRAM COMPLETE Result Date: 11/19/2023    ECHOCARDIOGRAM REPORT   Patient Name:   Michael Bullock Date of Exam: 11/19/2023 Medical Rec #:  [id number]     Height:       70.0 in Accession #:    [id number]    Weight:       160.0 lb Date of Birth:  1996/01/03    BSA:          1.898 m Patient Age:    27 years      BP:           113/75 mmHg Patient Gender: M             HR:           82 bpm. Exam Location:  Inpatient Procedure: 2D Echo, Cardiac Doppler and Color Doppler (Both Spectral and Color  Flow Doppler were utilized during procedure). Indications:    Syncope  History:        Patient has no prior history of Echocardiogram examinations.  Sonographer:    Astrid Blamer Referring Phys: [id number] ANASTASSIA DOUTOVA IMPRESSIONS  1. Left ventricular ejection fraction, by estimation, is 60 to 65%. The left ventricle has normal function. The left ventricle has no regional wall motion abnormalities. Left ventricular diastolic parameters were normal.  2. Right ventricular systolic function is normal. The right ventricular size is normal.  3. The mitral valve is grossly normal. No evidence of mitral valve regurgitation. No evidence of mitral stenosis.  4. The aortic valve was not well visualized. Aortic valve regurgitation is not visualized.  5. The inferior vena cava is normal in size with greater than 50% respiratory variability, suggesting right atrial pressure of 3 mmHg. Comparison(s): No prior Echocardiogram. FINDINGS  Left  Ventricle: No strain or 3D. Left ventricular ejection fraction, by estimation, is 60 to 65%. The left ventricle has normal function. The left ventricle has no regional wall motion abnormalities. Strain was performed and the global longitudinal strain is indeterminate. The left ventricular internal cavity size was normal in size. There is no left ventricular hypertrophy. Left ventricular diastolic parameters were normal. Right Ventricle: The right ventricular size is normal. No increase in right ventricular wall thickness. Right ventricular systolic function is normal. Left Atrium: Left atrial size was normal in size. Right Atrium: Right atrial size was normal in size. Pericardium: There is no evidence of pericardial effusion. Mitral Valve: Billowing anterior leaflet without true prolapse. The mitral valve is grossly normal. No evidence of mitral valve regurgitation. No evidence of mitral valve stenosis. Tricuspid Valve: The tricuspid valve is normal in structure. Tricuspid valve regurgitation is not demonstrated. No evidence of tricuspid stenosis. Aortic Valve: The aortic valve was not well visualized. Aortic valve regurgitation is not visualized. Aortic valve mean gradient measures 4.0 mmHg. Aortic valve peak gradient measures 6.4 mmHg. Aortic valve area, by VTI measures 3.06 cm. Pulmonic Valve: The pulmonic valve was normal in structure. Pulmonic valve regurgitation is not visualized. No evidence of pulmonic stenosis. Aorta: The aortic root is normal in size and structure and the ascending aorta was not well visualized. Venous: The inferior vena cava is normal in size with greater than 50% respiratory variability, suggesting right atrial pressure of 3 mmHg. IAS/Shunts: The atrial septum is grossly normal. Additional Comments: 3D was performed not requiring image post processing on an independent workstation and was indeterminate.  LEFT VENTRICLE PLAX 2D LVIDd:         5.10 cm   Diastology LVIDs:         3.80 cm    LV e' medial:    11.10 cm/s LV PW:         0.60 cm   LV E/e' medial:  7.6 LV IVS:        0.70 cm   LV e' lateral:   13.70 cm/s LVOT diam:     2.00 cm   LV E/e' lateral: 6.2 LV SV:         72 LV SV Index:   38 LVOT Area:     3.14 cm  RIGHT VENTRICLE RV S prime:     12.60 cm/s TAPSE (M-mode): 2.9 cm LEFT ATRIUM             Index        RIGHT ATRIUM           Index LA  Vol (A2C):   27.4 ml 14.43 ml/m  RA Area:     11.20 cm LA Vol (A4C):   23.6 ml 12.43 ml/m  RA Volume:   23.40 ml  12.33 ml/m LA Biplane Vol: 26.5 ml 13.96 ml/m  AORTIC VALVE AV Area (Vmax):    3.07 cm AV Area (Vmean):   3.14 cm AV Area (VTI):     3.06 cm AV Vmax:           126.00 cm/s AV Vmean:          90.400 cm/s AV VTI:            0.234 m AV Peak Grad:      6.4 mmHg AV Mean Grad:      4.0 mmHg LVOT Vmax:         123.00 cm/s LVOT Vmean:        90.300 cm/s LVOT VTI:          0.228 m LVOT/AV VTI ratio: 0.97  AORTA Ao Root diam: 3.10 cm MITRAL VALVE MV Area (PHT): 3.93 cm    SHUNTS MV E velocity: 84.40 cm/s  Systemic VTI:  0.23 m MV A velocity: 77.10 cm/s  Systemic Diam: 2.00 cm MV E/A ratio:  1.09 Gloriann Larger MD Electronically signed by Gloriann Larger MD Signature Date/Time: 11/19/2023/9:20:59 AM    Final    CT ABDOMEN PELVIS W CONTRAST Result Date: 11/18/2023 CLINICAL DATA:  Abdominal pain, acute, nonlocalized EXAM: CT ABDOMEN AND PELVIS WITH CONTRAST TECHNIQUE: Multidetector CT imaging of the abdomen and pelvis was performed using the standard protocol following bolus administration of intravenous contrast. RADIATION DOSE REDUCTION: This exam was performed according to the departmental dose-optimization program which includes automated exposure control, adjustment of the mA and/or kV according to patient size and/or use of iterative reconstruction technique. CONTRAST:  100mL OMNIPAQUE IOHEXOL 300 MG/ML  SOLN COMPARISON:  None Available. FINDINGS: Lower chest: No acute abnormality. Hepatobiliary: No focal liver abnormality is  seen. No gallstones, gallbladder wall thickening, or biliary dilatation. Pancreas: Unremarkable. No pancreatic ductal dilatation or surrounding inflammatory changes. Spleen: Normal in size without focal abnormality. Adrenals/Urinary Tract: Adrenal glands are unremarkable. Kidneys are normal, without renal calculi, focal lesion, or hydronephrosis. Bladder is unremarkable. Stomach/Bowel: Stomach is within normal limits. Appendix appears normal. No evidence of bowel wall thickening, distention, or inflammatory changes. Vascular/Lymphatic: No significant vascular findings are present. No enlarged abdominal or pelvic lymph nodes. Reproductive: Prostate is unremarkable. Other: No abdominal wall hernia or abnormality. No abdominopelvic ascites. 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 Scattered sclerotic foci, most notable in the left iliac bone which most likely represent bone islands. IMPRESSION: 1. No evidence of appendicitis or acute process. Electronically Signed   By: Reagan Camera M.D.   On: 11/18/2023 17:27     Subjective: No acute issues or events overnight   Discharge Exam: Vitals:   11/19/23 1911 11/20/23 0441  BP: 114/80 113/80  Pulse: 92 92  Resp: 16   Temp: 99.2 F (37.3 C) 97.9 F (36.6 C)  SpO2: 96% 97%   Vitals:   11/19/23 1040 11/19/23 1356 11/19/23 1911 11/20/23 0441  BP: 120/82 121/85 114/80 113/80  Pulse: 82 84 92 92  Resp: 16 20 16    Temp: 98.2 F (36.8 C) 98.3 F (36.8 C) 99.2 F (37.3 C) 97.9 F (36.6 C)  TempSrc:  Oral  Oral  SpO2: 99% 99% 96% 97%  Weight:      Height:  General: Pt is alert, awake, not in acute distress Cardiovascular: RRR, S1/S2 +, no rubs, no gallops Respiratory: CTA bilaterally, no wheezing, no rhonchi Abdominal: Soft, NT, ND, bowel sounds + Extremities: no edema, no cyanosis    The results of significant diagnostics from this hospitalization (including imaging, microbiology, ancillary and  laboratory) are listed below for reference.     Microbiology: Recent Results (from the past 240 hours)  Resp panel by RT-PCR (RSV, Flu A&B, Covid) Anterior Nasal Swab     Status: None   Collection Time: 11/18/23  3:10 PM   Specimen: Anterior Nasal Swab  Result Value Ref Range Status   SARS Coronavirus 2 by RT PCR NEGATIVE NEGATIVE Final    Comment: (NOTE) SARS-CoV-2 target nucleic acids are NOT DETECTED.  The SARS-CoV-2 RNA is generally detectable in upper respiratory specimens during the acute phase of infection. The lowest concentration of SARS-CoV-2 viral copies this assay can detect is 138 copies/mL. A negative result does not preclude SARS-Cov-2 infection and should not be used as the sole basis for treatment or other patient management decisions. A negative result may occur with  improper specimen collection/handling, submission of specimen other than nasopharyngeal swab, presence of viral mutation(s) within the areas targeted by this assay, and inadequate number of viral copies(<138 copies/mL). A negative result must be combined with clinical observations, patient history, and epidemiological information. The expected result is Negative.  Fact Sheet for Patients:  BloggerCourse.com  Fact Sheet for Healthcare Providers:  SeriousBroker.it  This test is no t yet approved or cleared by the [place]  FDA and  has been authorized for detection and/or diagnosis of SARS-CoV-2 by FDA under an Emergency Use Authorization (EUA). This EUA will remain  in effect (meaning this test can be used) for the duration of the COVID-19 declaration under Section 564(b)(1) of the Act, 21 U.S.C.section 360bbb-3(b)(1), unless the authorization is terminated  or revoked sooner.       Influenza A by PCR NEGATIVE NEGATIVE Final   Influenza B by PCR NEGATIVE NEGATIVE Final    Comment: (NOTE) The Xpert Xpress SARS-CoV-2/FLU/RSV plus assay is  intended as an aid in the diagnosis of influenza from Nasopharyngeal swab specimens and should not be used as a sole basis for treatment. Nasal washings and aspirates are unacceptable for Xpert Xpress SARS-CoV-2/FLU/RSV testing.  Fact Sheet for Patients: BloggerCourse.com  Fact Sheet for Healthcare Providers: SeriousBroker.it  This test is not yet approved or cleared by the [place]  FDA and has been authorized for detection and/or diagnosis of SARS-CoV-2 by FDA under an Emergency Use Authorization (EUA). This EUA will remain in effect (meaning this test can be used) for the duration of the COVID-19 declaration under Section 564(b)(1) of the Act, 21 U.S.C. section 360bbb-3(b)(1), unless the authorization is terminated or revoked.     Resp Syncytial Virus by PCR NEGATIVE NEGATIVE Final    Comment: (NOTE) Fact Sheet for Patients: BloggerCourse.com  Fact Sheet for Healthcare Providers: SeriousBroker.it  This test is not yet approved or cleared by the [place]  FDA and has been authorized for detection and/or diagnosis of SARS-CoV-2 by FDA under an Emergency Use Authorization (EUA). This EUA will remain in effect (meaning this test can be used) for the duration of the COVID-19 declaration under Section 564(b)(1) of the Act, 21 U.S.C. section 360bbb-3(b)(1), unless the authorization is terminated or revoked.  Performed at [facility], [address] [place], [place] [address]   Blood culture (routine x 2)  Status: None (Preliminary result)   Collection Time: 11/18/23  4:35 PM   Specimen: BLOOD LEFT FOREARM  Result Value Ref Range Status   Specimen Description   Final    BLOOD LEFT FOREARM Performed at [facility], [address] [place], [place] [address]    Special Requests   Final    BOTTLES DRAWN AEROBIC AND ANAEROBIC Blood Culture  adequate volume Performed at [facility], [address] [place], [place] [address]    Culture   Final    NO GROWTH 2 DAYS Performed at [facility], [address] [place], [place] [address]    Report Status PENDING  Incomplete  Blood culture (routine x 2)     Status: None (Preliminary result)   Collection Time: 11/18/23  5:00 PM   Specimen: Left Antecubital; Blood  Result Value Ref Range Status   Specimen Description   Final    LEFT ANTECUBITAL Performed at [facility], [address] [place], [place] [address]    Special Requests   Final    BOTTLES DRAWN AEROBIC AND ANAEROBIC Blood Culture adequate volume Performed at [facility], [address] [place], [place] [address]    Culture   Final    NO GROWTH 2 DAYS Performed at [facility], [address], [place], [place] [address]    Report Status PENDING  Incomplete  Urine Culture     Status: Abnormal   Collection Time: 11/18/23  5:40 PM   Specimen: Urine, Clean Catch  Result Value Ref Range Status   Specimen Description   Final    URINE, CLEAN CATCH Performed at [facility], [address] [place], [place] [address]    Special Requests   Final    NONE Performed at [facility], [address] [place], [place] [address]    Culture 10,000 COLONIES/mL KLEBSIELLA PNEUMONIAE (A)  Final   Report Status 11/20/2023 FINAL  Final   Organism ID, Bacteria KLEBSIELLA PNEUMONIAE (A)  Final      Susceptibility   Klebsiella pneumoniae - MIC*    AMPICILLIN >=32 RESISTANT Resistant     CEFAZOLIN <=4 SENSITIVE Sensitive     CEFEPIME  <=0.12 SENSITIVE Sensitive     CEFTRIAXONE <=0.25 SENSITIVE Sensitive     CIPROFLOXACIN <=0.25 SENSITIVE Sensitive     GENTAMICIN <=1 SENSITIVE Sensitive     IMIPENEM <=0.25 SENSITIVE Sensitive     NITROFURANTOIN 64 INTERMEDIATE Intermediate     TRIMETH/SULFA <=20 SENSITIVE Sensitive     AMPICILLIN/SULBACTAM 4 SENSITIVE  Sensitive     PIP/TAZO <=4 SENSITIVE Sensitive ug/mL    * 10,000 COLONIES/mL KLEBSIELLA PNEUMONIAE     Labs: BNP (last 3 results) No results for input(s): "BNP" in the last 8760 hours. Basic Metabolic Panel: Recent Labs  Lab 11/18/23 1510 11/19/23 0539  NA 136 134*  K 3.7 3.7  CL 98 104  CO2 24 25  GLUCOSE 111* 106*  BUN 13 11  CREATININE 1.33* 1.24  CALCIUM 10.2 8.7*  MG  --  1.8  PHOS  --  2.7   Liver Function Tests: Recent Labs  Lab 11/18/23 1510 11/19/23 0539  AST 29 18  ALT 38 23  ALKPHOS 114 68  BILITOT 0.6 1.1  PROT 7.5 6.1*  ALBUMIN 4.9 3.6   Recent Labs  Lab 11/18/23 1510  LIPASE 26   No results for input(s): "AMMONIA" in  the last 168 hours. CBC: Recent Labs  Lab 11/18/23 1510 11/19/23 0539  WBC 16.9* 13.3*  NEUTROABS 14.6*  --   HGB 15.1 12.6*  HCT 43.3 36.8*  MCV 88.7 93.4  PLT 184 128*   Cardiac Enzymes: Recent Labs  Lab 11/19/23 0539  CKTOTAL 36*   BNP: Invalid input(s): "POCBNP" CBG: No results for input(s): "GLUCAP" in the last 168 hours. D-Dimer Recent Labs    11/19/23 0539  DDIMER 0.30   Hgb A1c No results for input(s): "HGBA1C" in the last 72 hours. Lipid Profile No results for input(s): "CHOL", "HDL", "LDLCALC", "TRIG", "CHOLHDL", "LDLDIRECT" in the last 72 hours. Thyroid  function studies Recent Labs    11/19/23 0545  TSH 0.926   Anemia work up No results for input(s): "VITAMINB12", "FOLATE", "FERRITIN", "TIBC", "IRON", "RETICCTPCT" in the last 72 hours. Urinalysis    Component Value Date/Time   COLORURINE YELLOW 11/18/2023 1656   APPEARANCEUR CLEAR 11/18/2023 1656   LABSPEC 1.015 11/18/2023 1656   PHURINE 8.5 (H) 11/18/2023 1656   GLUCOSEU NEGATIVE 11/18/2023 1656   HGBUR NEGATIVE 11/18/2023 1656   BILIRUBINUR NEGATIVE 11/18/2023 1656   KETONESUR NEGATIVE 11/18/2023 1656   PROTEINUR 30 (A) 11/18/2023 1656   UROBILINOGEN 2.0 (H) 03/30/2014 1654   NITRITE NEGATIVE 11/18/2023 1656   LEUKOCYTESUR SMALL (A)  11/18/2023 1656   Sepsis Labs Recent Labs  Lab 11/18/23 1510 11/19/23 0539  WBC 16.9* 13.3*   Microbiology Recent Results (from the past 240 hours)  Resp panel by RT-PCR (RSV, Flu A&B, Covid) Anterior Nasal Swab     Status: None   Collection Time: 11/18/23  3:10 PM   Specimen: Anterior Nasal Swab  Result Value Ref Range Status   SARS Coronavirus 2 by RT PCR NEGATIVE NEGATIVE Final    Comment: (NOTE) SARS-CoV-2 target nucleic acids are NOT DETECTED.  The SARS-CoV-2 RNA is generally detectable in upper respiratory specimens during the acute phase of infection. The lowest concentration of SARS-CoV-2 viral copies this assay can detect is 138 copies/mL. A negative result does not preclude SARS-Cov-2 infection and should not be used as the sole basis for treatment or other patient management decisions. A negative result may occur with  improper specimen collection/handling, submission of specimen other than nasopharyngeal swab, presence of viral mutation(s) within the areas targeted by this assay, and inadequate number of viral copies(<138 copies/mL). A negative result must be combined with clinical observations, patient history, and epidemiological information. The expected result is Negative.  Fact Sheet for Patients:  BloggerCourse.com  Fact Sheet for Healthcare Providers:  SeriousBroker.it  This test is no t yet approved or cleared by the [place]  FDA and  has been authorized for detection and/or diagnosis of SARS-CoV-2 by FDA under an Emergency Use Authorization (EUA). This EUA will remain  in effect (meaning this test can be used) for the duration of the COVID-19 declaration under Section 564(b)(1) of the Act, 21 U.S.C.section 360bbb-3(b)(1), unless the authorization is terminated  or revoked sooner.       Influenza A by PCR NEGATIVE NEGATIVE Final   Influenza B by PCR NEGATIVE NEGATIVE Final    Comment:  (NOTE) The Xpert Xpress SARS-CoV-2/FLU/RSV plus assay is intended as an aid in the diagnosis of influenza from Nasopharyngeal swab specimens and should not be used as a sole basis for treatment. Nasal washings and aspirates are unacceptable for Xpert Xpress SARS-CoV-2/FLU/RSV testing.  Fact Sheet for Patients: BloggerCourse.com  Fact Sheet for Healthcare Providers: SeriousBroker.it  This test is not yet  approved or cleared by the [place]  FDA and has been authorized for detection and/or diagnosis of SARS-CoV-2 by FDA under an Emergency Use Authorization (EUA). This EUA will remain in effect (meaning this test can be used) for the duration of the COVID-19 declaration under Section 564(b)(1) of the Act, 21 U.S.C. section 360bbb-3(b)(1), unless the authorization is terminated or revoked.     Resp Syncytial Virus by PCR NEGATIVE NEGATIVE Final    Comment: (NOTE) Fact Sheet for Patients: BloggerCourse.com  Fact Sheet for Healthcare Providers: SeriousBroker.it  This test is not yet approved or cleared by the [place]  FDA and has been authorized for detection and/or diagnosis of SARS-CoV-2 by FDA under an Emergency Use Authorization (EUA). This EUA will remain in effect (meaning this test can be used) for the duration of the COVID-19 declaration under Section 564(b)(1) of the Act, 21 U.S.C. section 360bbb-3(b)(1), unless the authorization is terminated or revoked.  Performed at [facility], [address] [place], [place] [address]   Blood culture (routine x 2)     Status: None (Preliminary result)   Collection Time: 11/18/23  4:35 PM   Specimen: BLOOD LEFT FOREARM  Result Value Ref Range Status   Specimen Description   Final    BLOOD LEFT FOREARM Performed at [facility], [address] [place], [place] [address]    Special Requests   Final     BOTTLES DRAWN AEROBIC AND ANAEROBIC Blood Culture adequate volume Performed at [facility], [address] [place], [place] [address]    Culture   Final    NO GROWTH 2 DAYS Performed at [facility], [address], [place], [place] [address]    Report Status PENDING  Incomplete  Blood culture (routine x 2)     Status: None (Preliminary result)   Collection Time: 11/18/23  5:00 PM   Specimen: Left Antecubital; Blood  Result Value Ref Range Status   Specimen Description   Final    LEFT ANTECUBITAL Performed at [facility], [address] [place], [place] [address]    Special Requests   Final    BOTTLES DRAWN AEROBIC AND ANAEROBIC Blood Culture adequate volume Performed at [facility], [address] [place], [place] [address]    Culture   Final    NO GROWTH 2 DAYS Performed at [facility], [address] [place], [place] [address]    Report Status PENDING  Incomplete  Urine Culture     Status: Abnormal   Collection Time: 11/18/23  5:40 PM   Specimen: Urine, Clean Catch  Result Value Ref Range Status   Specimen Description   Final    URINE, CLEAN CATCH Performed at [facility], [address] [place], [place] [address]    Special Requests   Final    NONE Performed at [facility], [address] [place], [place] [address]    Culture 10,000 COLONIES/mL KLEBSIELLA PNEUMONIAE (A)  Final   Report Status 11/20/2023 FINAL  Final   Organism ID, Bacteria KLEBSIELLA PNEUMONIAE (A)  Final      Susceptibility   Klebsiella pneumoniae - MIC*    AMPICILLIN >=32 RESISTANT Resistant     CEFAZOLIN <=4 SENSITIVE Sensitive     CEFEPIME  <=0.12 SENSITIVE Sensitive     CEFTRIAXONE <=0.25 SENSITIVE Sensitive     CIPROFLOXACIN <=0.25 SENSITIVE Sensitive  GENTAMICIN <=1 SENSITIVE Sensitive     IMIPENEM <=0.25 SENSITIVE Sensitive     NITROFURANTOIN 64 INTERMEDIATE Intermediate     TRIMETH/SULFA <=20 SENSITIVE  Sensitive     AMPICILLIN/SULBACTAM 4 SENSITIVE Sensitive     PIP/TAZO <=4 SENSITIVE Sensitive ug/mL    * 10,000 COLONIES/mL KLEBSIELLA PNEUMONIAE     Time coordinating discharge: Over 30 minutes  SIGNED:   Haydee Lipa, DO [facility] 11/20/2023, 12:28 PM Pager   If 7PM-7AM, please contact night-coverage [email or web address]

## 2023-11-20 NOTE — Progress Notes (Signed)
 Discharge medication delivered E [place]one LPN D Jaydn Moscato RN

## 2023-11-20 NOTE — Progress Notes (Signed)
   11/20/23 1108  TOC Brief Assessment  Insurance and Status Reviewed  Patient has primary care physician Yes  Home environment has been reviewed Single family home  Prior level of function: Independent  Prior/Current Home Services No current home services  Social Drivers of Health Review SDOH reviewed no interventions necessary  Readmission risk has been reviewed Yes  Transition of care needs no transition of care needs at this time

## 2023-11-23 LAB — CULTURE, BLOOD (ROUTINE X 2)
Culture: NO GROWTH
Culture: NO GROWTH
Special Requests: ADEQUATE
Special Requests: ADEQUATE

## 2024-06-08 ENCOUNTER — Other Ambulatory Visit (HOSPITAL_COMMUNITY): Payer: Self-pay
